# Patient Record
Sex: Male | Born: 1950 | Race: White | Hispanic: No | Marital: Single | Smoking: Former smoker
Health system: Southern US, Community
[De-identification: ages and names within clinical notes are randomized; demographics above are authoritative.]

## PROBLEM LIST (undated history)

## (undated) DIAGNOSIS — K219 Gastro-esophageal reflux disease without esophagitis: Secondary | ICD-10-CM

## (undated) HISTORY — PX: OTHER SURGICAL HISTORY: SHX169

## (undated) HISTORY — PX: HERNIA REPAIR: SHX51

## (undated) HISTORY — PX: EYE SURGERY: SHX253

## (undated) HISTORY — PX: APPENDECTOMY: SHX54

---

## 2005-12-22 ENCOUNTER — Ambulatory Visit (HOSPITAL_COMMUNITY): Admission: RE | Admit: 2005-12-22 | Discharge: 2005-12-22 | Payer: Self-pay | Admitting: Ophthalmology

## 2011-12-27 NOTE — Patient Instructions (Signed)
[name]  12/27/2011   Your procedure is scheduled on:  12/29/2011  Report to [facility] at  1020  AM.  Call this number if you have problems the morning of surgery: [phone]   Remember:   Do not eat food:After Midnight.  May have clear liquids:until Midnight .  Clear liquids include soda, tea, black coffee, apple or grape juice, broth.  Take these medicines the morning of surgery with A SIP OF WATER: none   Do not wear jewelry, make-up or nail polish.  Do not wear lotions, powders, or perfumes. You may wear deodorant.  Do not shave 48 hours prior to surgery.  Do not bring valuables to the hospital.  Contacts, dentures or bridgework may not be worn into surgery.  Leave suitcase in the car. After surgery it may be brought to your room.  For patients admitted to the hospital, checkout time is 11:00 AM the day of discharge.   Patients discharged the day of surgery will not be allowed to drive home.  Name and phone number of your driver: family Special Instructions: N/A   Please read over the following fact sheets that you were given: Pain Booklet, Surgical Site Infection Prevention, Anesthesia Post-op Instructions and Care and Recovery After Surgery Cataract A cataract is a clouding of the lens of the eye. When a lens becomes cloudy, vision is reduced based on the degree and nature of the clouding. Many cataracts reduce vision to some degree. Some cataracts make people more near-sighted as they develop. Other cataracts increase glare. Cataracts that are ignored and become worse can sometimes look white. The white color can be seen through the pupil. CAUSES   Aging. However, cataracts may occur at any age, even in newborns.   Certain drugs.   Trauma to the eye.   Certain diseases such as diabetes.   Specific eye diseases such as chronic inflammation inside the eye or a sudden attack of a rare form of glaucoma.   Inherited or acquired medical problems.  SYMPTOMS   Gradual,  progressive drop in vision in the affected eye.   Severe, rapid visual loss. This most often happens when trauma is the cause.  DIAGNOSIS  To detect a cataract, an eye doctor examines the lens. Cataracts are best diagnosed with an exam of the eyes with the pupils enlarged (dilated) by drops.  TREATMENT  For an early cataract, vision may improve by using different eyeglasses or stronger lighting. If that does not help your vision, surgery is the only effective treatment. A cataract needs to be surgically removed when vision loss interferes with your everyday activities, such as driving, reading, or watching TV. A cataract may also have to be removed if it prevents examination or treatment of another eye problem. Surgery removes the cloudy lens and usually replaces it with a substitute lens (intraocular lens, IOL).  At a time when both you and your doctor agree, the cataract will be surgically removed. If you have cataracts in both eyes, only one is usually removed at a time. This allows the operated eye to heal and be out of danger from any possible problems after surgery (such as infection or poor wound healing). In rare cases, a cataract may be doing damage to your eye. In these cases, your caregiver may advise surgical removal right away. The vast majority of people who have cataract surgery have better vision afterward. HOME CARE INSTRUCTIONS  If you are not planning surgery, you may be asked to  do the following:  Use different eyeglasses.   Use stronger or brighter lighting.   Ask your eye doctor about reducing your medicine dose or changing medicines if it is thought that a medicine caused your cataract. Changing medicines does not make the cataract go away on its own.   Become familiar with your surroundings. Poor vision can lead to injury. Avoid bumping into things on the affected side. You are at a higher risk for tripping or falling.   Exercise extreme care when driving or operating  machinery.   Wear sunglasses if you are sensitive to bright light or experiencing problems with glare.  SEEK IMMEDIATE MEDICAL CARE IF:   You have a worsening or sudden vision loss.   You notice redness, swelling, or increasing pain in the eye.   You have a fever.  Document Released: 09/12/2005 Document Revised: 09/01/2011 Document Reviewed: 05/06/2011 [facility] Patient Information 2012 [place], [place].PATIENT INSTRUCTIONS POST-ANESTHESIA  IMMEDIATELY FOLLOWING SURGERY:  Do not drive or operate machinery for the first twenty four hours after surgery.  Do not make any important decisions for twenty four hours after surgery or while taking narcotic pain medications or sedatives.  If you develop intractable nausea and vomiting or a severe headache please notify your doctor immediately.  FOLLOW-UP:  Please make an appointment with your surgeon as instructed. You do not need to follow up with anesthesia unless specifically instructed to do so.  WOUND CARE INSTRUCTIONS (if applicable):  Keep a dry clean dressing on the anesthesia/puncture wound site if there is drainage.  Once the wound has quit draining you may leave it open to air.  Generally you should leave the bandage intact for twenty four hours unless there is drainage.  If the epidural site drains for more than 36-48 hours please call the anesthesia department.  QUESTIONS?:  Please feel free to call your physician or the hospital operator if you have any questions, and they will be happy to assist you.     [facility] [address] Anesthesia Department [address] [place] [phone]

## 2011-12-28 ENCOUNTER — Encounter (HOSPITAL_COMMUNITY): Payer: Self-pay

## 2011-12-28 ENCOUNTER — Encounter (HOSPITAL_COMMUNITY)
Admission: RE | Admit: 2011-12-28 | Discharge: 2011-12-28 | Disposition: A | Discharge status: Home / Self Care | Payer: BC Managed Care – PPO | Source: Ambulatory Visit | Attending: Ophthalmology | Admitting: Ophthalmology

## 2011-12-28 ENCOUNTER — Other Ambulatory Visit: Payer: Self-pay

## 2011-12-28 ENCOUNTER — Encounter (HOSPITAL_COMMUNITY): Payer: Self-pay | Admitting: Pharmacy Technician

## 2011-12-28 HISTORY — DX: Gastro-esophageal reflux disease without esophagitis: K21.9

## 2011-12-28 LAB — HEMOGLOBIN AND HEMATOCRIT, BLOOD
HCT: 44.8 % (ref 39.0–52.0)
Hemoglobin: 14.9 g/dL (ref 13.0–17.0)

## 2011-12-28 LAB — BASIC METABOLIC PANEL
GFR calc Af Amer: >90 mL/min (ref >90)
GFR calc non Af Amer: >90 mL/min (ref >90)
Glucose, Bld: 136 mg/dL — ABNORMAL HIGH (ref 70–99)
Potassium: 4.7 mEq/L (ref 3.5–5.1)
Sodium: 137 mEq/L (ref 135–145)

## 2011-12-29 ENCOUNTER — Encounter (HOSPITAL_COMMUNITY)
Admission: RE | Disposition: A | Discharge status: Home / Self Care | Payer: Self-pay | Source: Ambulatory Visit | Attending: Ophthalmology

## 2011-12-29 ENCOUNTER — Encounter (HOSPITAL_COMMUNITY): Payer: Self-pay | Admitting: *Deleted

## 2011-12-29 ENCOUNTER — Ambulatory Visit (HOSPITAL_COMMUNITY)
Admission: RE | Admit: 2011-12-29 | Discharge: 2011-12-29 | Disposition: A | Discharge status: Home / Self Care | Payer: BC Managed Care – PPO | Source: Ambulatory Visit | Attending: Ophthalmology | Admitting: Ophthalmology

## 2011-12-29 ENCOUNTER — Encounter (HOSPITAL_COMMUNITY): Payer: Self-pay | Admitting: Anesthesiology

## 2011-12-29 ENCOUNTER — Ambulatory Visit (HOSPITAL_COMMUNITY): Payer: BC Managed Care – PPO | Admitting: Anesthesiology

## 2011-12-29 DIAGNOSIS — H2589 Other age-related cataract: Secondary | ICD-10-CM | POA: Insufficient documentation

## 2011-12-29 DIAGNOSIS — Z01812 Encounter for preprocedural laboratory examination: Secondary | ICD-10-CM | POA: Insufficient documentation

## 2011-12-29 DIAGNOSIS — Z01810 Encounter for preprocedural cardiovascular examination: Secondary | ICD-10-CM | POA: Insufficient documentation

## 2011-12-29 HISTORY — PX: CATARACT EXTRACTION W/PHACO: SHX586

## 2011-12-29 SURGERY — PHACOEMULSIFICATION, CATARACT, WITH IOL INSERTION
Anesthesia: Monitor Anesthesia Care | Site: Eye | Laterality: Right | Wound class: Clean

## 2011-12-29 MED ORDER — LACTATED RINGERS IV SOLN
INTRAVENOUS | Status: DC
  Administered 2011-12-29: 12:00:00 via INTRAVENOUS

## 2011-12-29 MED ORDER — CYCLOPENTOLATE-PHENYLEPHRINE 0.2-1 % OP SOLN
1.0000 [drp] | OPHTHALMIC | Status: AC
  Administered 2011-12-29 (×3): 1 [drp] via OPHTHALMIC

## 2011-12-29 MED ORDER — MIDAZOLAM HCL 2 MG/2ML IJ SOLN
1.0000 mg | INTRAMUSCULAR | Status: DC | PRN
  Administered 2011-12-29: 2 mg via INTRAVENOUS

## 2011-12-29 MED ORDER — ONDANSETRON HCL 4 MG/2ML IJ SOLN: 4.0000 mg | Freq: Once | INTRAMUSCULAR | Status: DC | PRN

## 2011-12-29 MED ORDER — POVIDONE-IODINE 5 % OP SOLN
OPHTHALMIC | Status: DC | PRN
  Administered 2011-12-29: 1 via OPHTHALMIC

## 2011-12-29 MED ORDER — LIDOCAINE HCL (PF) 1 % IJ SOLN
INTRAMUSCULAR | Status: AC
  Filled 2011-12-29: qty 2

## 2011-12-29 MED ORDER — PHENYLEPHRINE HCL 2.5 % OP SOLN
1.0000 [drp] | OPHTHALMIC | Status: AC
  Administered 2011-12-29 (×3): 1 [drp] via OPHTHALMIC

## 2011-12-29 MED ORDER — PROVISC 10 MG/ML IO SOLN
INTRAOCULAR | Status: DC | PRN
  Administered 2011-12-29: 8.5 mg via INTRAOCULAR

## 2011-12-29 MED ORDER — NEOMYCIN-POLYMYXIN-DEXAMETH 3.5-10000-0.1 OP OINT
TOPICAL_OINTMENT | OPHTHALMIC | Status: AC
  Filled 2011-12-29: qty 3.5

## 2011-12-29 MED ORDER — LIDOCAINE 3.5 % OP GEL OPTIME - NO CHARGE
OPHTHALMIC | Status: DC | PRN
  Administered 2011-12-29: 2 [drp] via OPHTHALMIC

## 2011-12-29 MED ORDER — EPINEPHRINE HCL 1 MG/ML IJ SOLN
INTRAOCULAR | Status: DC | PRN
  Administered 2011-12-29: 12:00:00

## 2011-12-29 MED ORDER — BSS IO SOLN
INTRAOCULAR | Status: DC | PRN
  Administered 2011-12-29: 15 mL via INTRAOCULAR

## 2011-12-29 MED ORDER — CYCLOPENTOLATE-PHENYLEPHRINE 0.2-1 % OP SOLN
OPHTHALMIC | Status: AC
  Filled 2011-12-29: qty 2

## 2011-12-29 MED ORDER — LIDOCAINE HCL 3.5 % OP GEL
OPHTHALMIC | Status: AC
  Filled 2011-12-29: qty 5

## 2011-12-29 MED ORDER — TETRACAINE HCL 0.5 % OP SOLN
1.0000 [drp] | OPHTHALMIC | Status: AC
  Administered 2011-12-29 (×3): 1 [drp] via OPHTHALMIC

## 2011-12-29 MED ORDER — PHENYLEPHRINE HCL 2.5 % OP SOLN
OPHTHALMIC | Status: AC
  Filled 2011-12-29: qty 2

## 2011-12-29 MED ORDER — EPINEPHRINE HCL 1 MG/ML IJ SOLN
INTRAMUSCULAR | Status: AC
  Filled 2011-12-29: qty 1

## 2011-12-29 MED ORDER — LIDOCAINE HCL 3.5 % OP GEL: 1.0000 "application " | Freq: Once | OPHTHALMIC | Status: DC

## 2011-12-29 MED ORDER — NEOMYCIN-POLYMYXIN-DEXAMETH 0.1 % OP OINT
TOPICAL_OINTMENT | OPHTHALMIC | Status: DC | PRN
  Administered 2011-12-29: 1 via OPHTHALMIC

## 2011-12-29 MED ORDER — TETRACAINE HCL 0.5 % OP SOLN
OPHTHALMIC | Status: AC
  Filled 2011-12-29: qty 2

## 2011-12-29 MED ORDER — LIDOCAINE HCL (PF) 1 % IJ SOLN
INTRAMUSCULAR | Status: DC | PRN
  Administered 2011-12-29: .6 mL

## 2011-12-29 MED ORDER — FENTANYL CITRATE 0.05 MG/ML IJ SOLN: 25.0000 ug | INTRAMUSCULAR | Status: DC | PRN

## 2011-12-29 MED ORDER — MIDAZOLAM HCL 2 MG/2ML IJ SOLN
INTRAMUSCULAR | Status: AC
  Filled 2011-12-29: qty 2

## 2011-12-29 SURGICAL SUPPLY — 32 items
CAPSULAR TENSION RING-AMO (OPHTHALMIC RELATED) IMPLANT
CLOTH BEACON ORANGE TIMEOUT ST (SAFETY) ×1 IMPLANT
EYE SHIELD UNIVERSAL CLEAR (GAUZE/BANDAGES/DRESSINGS) ×2 IMPLANT
GLOVE BIO SURGEON STRL SZ 6.5 (GLOVE) IMPLANT
GLOVE BIOGEL PI IND STRL 6.5 (GLOVE) IMPLANT
GLOVE BIOGEL PI IND STRL 7.0 (GLOVE) IMPLANT
GLOVE BIOGEL PI IND STRL 7.5 (GLOVE) IMPLANT
GLOVE BIOGEL PI INDICATOR 6.5 (GLOVE) ×1
GLOVE BIOGEL PI INDICATOR 7.0 (GLOVE)
GLOVE BIOGEL PI INDICATOR 7.5 (GLOVE)
GLOVE ECLIPSE 6.5 STRL STRAW (GLOVE) IMPLANT
GLOVE ECLIPSE 7.0 STRL STRAW (GLOVE) IMPLANT
GLOVE ECLIPSE 7.5 STRL STRAW (GLOVE) IMPLANT
GLOVE EXAM NITRILE LRG STRL (GLOVE) IMPLANT
GLOVE EXAM NITRILE MD LF STRL (GLOVE) ×1 IMPLANT
GLOVE SKINSENSE NS SZ6.5 (GLOVE)
GLOVE SKINSENSE NS SZ7.0 (GLOVE)
GLOVE SKINSENSE STRL SZ6.5 (GLOVE) IMPLANT
GLOVE SKINSENSE STRL SZ7.0 (GLOVE) IMPLANT
KIT VITRECTOMY (OPHTHALMIC RELATED) IMPLANT
PAD ARMBOARD 7.5X6 YLW CONV (MISCELLANEOUS) ×1 IMPLANT
PROC W NO LENS (INTRAOCULAR LENS)
PROC W SPEC LENS (INTRAOCULAR LENS)
PROCESS W NO LENS (INTRAOCULAR LENS) IMPLANT
PROCESS W SPEC LENS (INTRAOCULAR LENS) IMPLANT
RING MALYGIN (MISCELLANEOUS) IMPLANT
SIGHTPATH CAT PROC W REG LENS (Ophthalmic Related) ×2 IMPLANT
SYR TB 1ML LL NO SAFETY (SYRINGE) ×1 IMPLANT
TAPE SURG TRANSPORE 1 IN (GAUZE/BANDAGES/DRESSINGS) IMPLANT
TAPE SURGICAL TRANSPORE 1 IN (GAUZE/BANDAGES/DRESSINGS) ×1
VISCOELASTIC ADDITIONAL (OPHTHALMIC RELATED) IMPLANT
WATER STERILE IRR 250ML POUR (IV SOLUTION) ×1 IMPLANT

## 2011-12-29 NOTE — Brief Op Note (Signed)
Pre-Op Dx: Cataract OD Post-Op Dx: Cataract OD Surgeon: Shameca Landen Anesthesia: Topical with MAC Implant: Lenstec, Model Softec HD Blood Loss: None Specimen: None Complications: None 

## 2011-12-29 NOTE — Anesthesia Preprocedure Evaluation (Signed)
Anesthesia Evaluation  Patient identified by MRN, date of birth, ID band Patient awake    Reviewed: Allergy & Precautions, H&P , NPO status , Patient's Chart, lab work & pertinent test results  Airway Mallampati: I      Dental  (+) Teeth Intact   Pulmonary former smoker breath sounds clear to auscultation        Cardiovascular negative cardio ROS  Rhythm:Regular Rate:Normal     Neuro/Psych    GI/Hepatic GERD-  Medicated and Controlled,  Endo/Other    Renal/GU      Musculoskeletal   Abdominal   Peds  Hematology   Anesthesia Other Findings   Reproductive/Obstetrics                           Anesthesia Physical Anesthesia Plan  ASA: II  Anesthesia Plan: MAC   Post-op Pain Management:    Induction: Intravenous  Airway Management Planned: Nasal Cannula  Additional Equipment:   Intra-op Plan:   Post-operative Plan:   Informed Consent: I have reviewed the patients History and Physical, chart, labs and discussed the procedure including the risks, benefits and alternatives for the proposed anesthesia with the patient or authorized representative who has indicated his/her understanding and acceptance.     Plan Discussed with:   Anesthesia Plan Comments:         Anesthesia Quick Evaluation

## 2011-12-29 NOTE — Discharge Instructions (Signed)
Tyler Boone  12/29/2011     Instructions  1. Use medications as Instructed.  Shake well before use. Wait 5 minutes between drops.  {OPHTHALMIC ANTIBIOTICS:22167} 4 times a day x 1 week.  {OPHTHALMIC ANTI-INFLAMMATORY:22168} 2 times a day x 4 weeks.  {OPHTHALMIC STEROID:22169} 4 times a day - week 1   3 times a day - Week 2, 2 times a day- Week 3, 1 time a day - Week 4.  2. Do not rub the operative eye. Do not swim underwater for 2 weeks.  3. You may remove the clear shield and resume your normal activities the day after  Surgery. Your eyes may feel more comfortable if you wear dark glasses outside.  4. Call our office at [phone] if you have sudden change in vision, extreme redness or pain. Some fluctuation in vision is normal after surgery. If you have an emergency after hours, call Dr. Alto Denver at [phone].  5. It is important that you attend all of your follow-up appointments.        Follow-up:{follow up:32580} with Gemma Payor, MD.   Dr. Lahoma Crocker: [phone]  Dr. Lita Mains: [phone]  Dr. Alto Denver: [phone]   If you find that you cannot contact your physician, but feel that your signs and   Symptoms warrant a physician's attention, call the Emergency Room at   [phone].   Other{NA AND [id number]}.

## 2011-12-29 NOTE — Transfer of Care (Signed)
Immediate Anesthesia Transfer of Care Note  Patient: Tyler Boone  Procedure(s) Performed: Procedure(s) (LRB): CATARACT EXTRACTION PHACO AND INTRAOCULAR LENS PLACEMENT (IOC) (Right)  Patient Location: PACU and Short Stay  Anesthesia Type: MAC  Level of Consciousness: awake, alert , oriented and patient cooperative  Airway & Oxygen Therapy: Patient Spontanous Breathing  Post-op Assessment: Report given to PACU RN, Post -op Vital signs reviewed and stable and Patient moving all extremities  Post vital signs: Reviewed and stable  Complications: No apparent anesthesia complications

## 2011-12-29 NOTE — H&P (Signed)
I have reviewed the H&P, the patient was re-examined, and I have identified no interval changes in medical condition and plan of care since the history and physical of record  

## 2011-12-29 NOTE — Anesthesia Postprocedure Evaluation (Signed)
  Anesthesia Post-op Note  Patient: Tyler Boone  Procedure(s) Performed: Procedure(s) (LRB): CATARACT EXTRACTION PHACO AND INTRAOCULAR LENS PLACEMENT (IOC) (Right)  Patient Location: PACU and Short Stay  Anesthesia Type: MAC  Level of Consciousness: awake, alert , oriented and patient cooperative  Airway and Oxygen Therapy: Patient Spontanous Breathing  Post-op Pain: none  Post-op Assessment: Post-op Vital signs reviewed, Patient's Cardiovascular Status Stable, Respiratory Function Stable, Patent Airway and No signs of Nausea or vomiting  Post-op Vital Signs: Reviewed and stable  Complications: No apparent anesthesia complications

## 2011-12-30 NOTE — Op Note (Signed)
NAME:  Tyler Boone, Tyler Boone NO.:  [id number]  MEDICAL RECORD NO.:  [id number]  LOCATION:  [facility]                          FACILITY:  [facility]  PHYSICIAN:  Susanne Greenhouse, MD       DATE OF BIRTH:  05/14/1951  DATE OF PROCEDURE:  12/29/2011 DATE OF DISCHARGE:  12/29/2011                              OPERATIVE REPORT   PREOPERATIVE DIAGNOSIS:  Combined cataract, right eye, diagnosis code 366.19.  POSTOPERATIVE DIAGNOSIS:  Combined cataract, right eye, diagnosis code 366.19.  OPERATION PERFORMED:  Phacoemulsification with posterior chamber intraocular lens implantation, right eye.  SURGEON:  Bonne Dolores. Aadvik Roker, MD  ANESTHESIA:  General endotracheal anesthesia.  OPERATIVE SUMMARY:  In the preoperative area, dilating drops were placed into the right eye.  The patient was then brought into the operating room where she was placed under general anesthesia.  The eye was then prepped and draped.  Beginning with a 75 blade, a paracentesis port was made at the surgeon's 2 o'clock position.  The anterior chamber was then filled with a 1% nonpreserved lidocaine solution with epinephrine.  This was followed by Viscoat to deepen the chamber.  A small fornix-based peritomy was performed superiorly.  Next, a single iris hook was placed through the limbus superiorly.  A 2.4-mm keratome blade was then used to make a clear corneal incision over the iris hook.  A bent cystotome needle and Utrata forceps were used to create a continuous tear capsulotomy.  Hydrodissection was performed using balanced salt solution on a fine cannula.  The lens nucleus was then removed using phacoemulsification in a quadrant cracking technique.  The cortical material was then removed with irrigation and aspiration.  The capsular bag and anterior chamber were refilled with Provisc.  The wound was widened to approximately 3 mm and a posterior chamber intraocular lens was placed into the capsular bag without difficulty  using an [employer] lens injecting system.  A single 10-0 nylon suture was then used to close the incision as well as stromal hydration.  The Provisc was removed from the anterior chamber and capsular bag with irrigation and aspiration.  At this point, the wounds were tested for leak, which were negative.  The anterior chamber remained deep and stable.  The patient tolerated the procedure well.  There were no operative complications, and she awoke from general anesthesia without problem.  No surgical specimens.  Prosthetic device used is a Lenstec posterior chamber lens, model Softec HD, power of 23.75, serial number is [id number].          ______________________________ Susanne Greenhouse, MD     KEH/MEDQ  D:  12/29/2011  T:  12/30/2011  Job:  [id number]

## 2012-01-02 ENCOUNTER — Encounter (HOSPITAL_COMMUNITY): Payer: Self-pay | Admitting: Ophthalmology

## 2013-08-01 ENCOUNTER — Other Ambulatory Visit (HOSPITAL_COMMUNITY): Payer: Self-pay | Admitting: Pulmonary Disease

## 2013-08-01 DIAGNOSIS — R634 Abnormal weight loss: Secondary | ICD-10-CM

## 2013-08-01 DIAGNOSIS — R110 Nausea: Secondary | ICD-10-CM

## 2013-08-05 ENCOUNTER — Other Ambulatory Visit (HOSPITAL_COMMUNITY): Payer: Self-pay | Admitting: Pulmonary Disease

## 2013-08-05 ENCOUNTER — Ambulatory Visit (HOSPITAL_COMMUNITY)
Admission: RE | Admit: 2013-08-05 | Discharge: 2013-08-05 | Disposition: A | Discharge status: Home / Self Care | Payer: BC Managed Care – PPO | Source: Ambulatory Visit | Attending: Pulmonary Disease | Admitting: Pulmonary Disease

## 2013-08-05 DIAGNOSIS — R110 Nausea: Secondary | ICD-10-CM

## 2013-08-05 DIAGNOSIS — R634 Abnormal weight loss: Secondary | ICD-10-CM

## 2013-08-09 ENCOUNTER — Ambulatory Visit (HOSPITAL_COMMUNITY)
Admission: RE | Admit: 2013-08-09 | Discharge: 2013-08-09 | Disposition: A | Discharge status: Home / Self Care | Payer: BC Managed Care – PPO | Source: Ambulatory Visit | Attending: Pulmonary Disease | Admitting: Pulmonary Disease

## 2013-08-09 DIAGNOSIS — R634 Abnormal weight loss: Secondary | ICD-10-CM | POA: Insufficient documentation

## 2013-08-09 DIAGNOSIS — M47817 Spondylosis without myelopathy or radiculopathy, lumbosacral region: Secondary | ICD-10-CM | POA: Insufficient documentation

## 2013-08-09 DIAGNOSIS — R110 Nausea: Secondary | ICD-10-CM | POA: Insufficient documentation

## 2013-08-09 DIAGNOSIS — N281 Cyst of kidney, acquired: Secondary | ICD-10-CM | POA: Insufficient documentation

## 2013-08-09 DIAGNOSIS — N400 Enlarged prostate without lower urinary tract symptoms: Secondary | ICD-10-CM | POA: Insufficient documentation

## 2013-08-09 DIAGNOSIS — K429 Umbilical hernia without obstruction or gangrene: Secondary | ICD-10-CM | POA: Insufficient documentation

## 2013-08-09 LAB — POCT I-STAT CREATININE: Creatinine, Ser: 1.1 mg/dL (ref 0.50–1.35)

## 2013-08-09 MED ORDER — IOHEXOL 300 MG/ML  SOLN
100.0000 mL | Freq: Once | INTRAMUSCULAR | Status: AC | PRN
  Administered 2013-08-09: 100 mL via INTRAVENOUS

## 2014-09-16 ENCOUNTER — Ambulatory Visit (HOSPITAL_COMMUNITY)
Admission: RE | Admit: 2014-09-16 | Discharge: 2014-09-16 | Disposition: A | Discharge status: Home / Self Care | Payer: BC Managed Care – PPO | Source: Ambulatory Visit | Attending: Pulmonary Disease | Admitting: Pulmonary Disease

## 2014-09-16 ENCOUNTER — Other Ambulatory Visit (HOSPITAL_COMMUNITY): Payer: Self-pay | Admitting: Pulmonary Disease

## 2014-09-16 DIAGNOSIS — M5032 Other cervical disc degeneration, mid-cervical region: Secondary | ICD-10-CM | POA: Diagnosis not present

## 2014-09-16 DIAGNOSIS — M542 Cervicalgia: Secondary | ICD-10-CM

## 2014-09-16 DIAGNOSIS — M25512 Pain in left shoulder: Secondary | ICD-10-CM

## 2014-09-16 DIAGNOSIS — M79671 Pain in right foot: Secondary | ICD-10-CM | POA: Diagnosis present

## 2014-09-16 DIAGNOSIS — M7731 Calcaneal spur, right foot: Secondary | ICD-10-CM | POA: Diagnosis not present

## 2014-10-08 ENCOUNTER — Other Ambulatory Visit (HOSPITAL_COMMUNITY): Payer: Self-pay | Admitting: Pulmonary Disease

## 2014-10-08 DIAGNOSIS — M25519 Pain in unspecified shoulder: Secondary | ICD-10-CM

## 2014-10-08 DIAGNOSIS — M542 Cervicalgia: Principal | ICD-10-CM

## 2014-10-15 ENCOUNTER — Ambulatory Visit (HOSPITAL_COMMUNITY)
Admission: RE | Admit: 2014-10-15 | Discharge: 2014-10-15 | Disposition: A | Discharge status: Home / Self Care | Payer: BLUE CROSS/BLUE SHIELD | Source: Ambulatory Visit | Attending: Pulmonary Disease | Admitting: Pulmonary Disease

## 2014-10-15 DIAGNOSIS — M25519 Pain in unspecified shoulder: Secondary | ICD-10-CM

## 2014-10-15 DIAGNOSIS — M542 Cervicalgia: Secondary | ICD-10-CM | POA: Diagnosis not present

## 2016-06-03 ENCOUNTER — Other Ambulatory Visit (HOSPITAL_COMMUNITY): Payer: Self-pay | Admitting: Pulmonary Disease

## 2016-06-03 DIAGNOSIS — M545 Low back pain: Secondary | ICD-10-CM

## 2016-06-14 ENCOUNTER — Ambulatory Visit: Payer: BLUE CROSS/BLUE SHIELD

## 2016-06-16 ENCOUNTER — Ambulatory Visit (HOSPITAL_COMMUNITY)
Admission: RE | Admit: 2016-06-16 | Discharge: 2016-06-16 | Disposition: A | Discharge status: Home / Self Care | Payer: BLUE CROSS/BLUE SHIELD | Source: Ambulatory Visit | Attending: Pulmonary Disease | Admitting: Pulmonary Disease

## 2016-06-16 DIAGNOSIS — M545 Low back pain: Secondary | ICD-10-CM | POA: Insufficient documentation

## 2016-06-16 DIAGNOSIS — M47896 Other spondylosis, lumbar region: Secondary | ICD-10-CM | POA: Diagnosis not present

## 2016-07-07 ENCOUNTER — Other Ambulatory Visit: Payer: Self-pay | Admitting: Pulmonary Disease

## 2016-07-07 DIAGNOSIS — M545 Low back pain: Secondary | ICD-10-CM

## 2016-07-15 ENCOUNTER — Ambulatory Visit
Admission: RE | Admit: 2016-07-15 | Discharge: 2016-07-15 | Disposition: A | Discharge status: Home / Self Care | Payer: BLUE CROSS/BLUE SHIELD | Source: Ambulatory Visit | Attending: Pulmonary Disease | Admitting: Pulmonary Disease

## 2016-07-15 DIAGNOSIS — M545 Low back pain: Secondary | ICD-10-CM

## 2016-07-15 MED ORDER — METHYLPREDNISOLONE ACETATE 40 MG/ML INJ SUSP (RADIOLOG
120.0000 mg | Freq: Once | INTRAMUSCULAR | Status: AC
  Administered 2016-07-15: 120 mg via EPIDURAL

## 2016-07-15 MED ORDER — IOPAMIDOL (ISOVUE-M 200) INJECTION 41%
1.0000 mL | Freq: Once | INTRAMUSCULAR | Status: AC
  Administered 2016-07-15: 1 mL via EPIDURAL

## 2016-07-15 NOTE — Discharge Instructions (Signed)

## 2017-04-04 DIAGNOSIS — J301 Allergic rhinitis due to pollen: Secondary | ICD-10-CM | POA: Diagnosis not present

## 2017-04-04 DIAGNOSIS — M542 Cervicalgia: Secondary | ICD-10-CM | POA: Diagnosis not present

## 2017-04-04 DIAGNOSIS — G4700 Insomnia, unspecified: Secondary | ICD-10-CM | POA: Diagnosis not present

## 2017-04-04 DIAGNOSIS — M545 Low back pain: Secondary | ICD-10-CM | POA: Diagnosis not present

## 2017-05-02 DIAGNOSIS — B078 Other viral warts: Secondary | ICD-10-CM | POA: Diagnosis not present

## 2017-05-25 DIAGNOSIS — Z79891 Long term (current) use of opiate analgesic: Secondary | ICD-10-CM | POA: Diagnosis not present

## 2017-08-23 DIAGNOSIS — M545 Low back pain: Secondary | ICD-10-CM | POA: Diagnosis not present

## 2017-08-23 DIAGNOSIS — Z23 Encounter for immunization: Secondary | ICD-10-CM | POA: Diagnosis not present

## 2017-08-23 DIAGNOSIS — M25512 Pain in left shoulder: Secondary | ICD-10-CM | POA: Diagnosis not present

## 2017-08-23 DIAGNOSIS — G4700 Insomnia, unspecified: Secondary | ICD-10-CM | POA: Diagnosis not present

## 2017-08-23 DIAGNOSIS — J301 Allergic rhinitis due to pollen: Secondary | ICD-10-CM | POA: Diagnosis not present

## 2017-11-22 DIAGNOSIS — F17200 Nicotine dependence, unspecified, uncomplicated: Secondary | ICD-10-CM | POA: Diagnosis not present

## 2017-11-22 DIAGNOSIS — G4700 Insomnia, unspecified: Secondary | ICD-10-CM | POA: Diagnosis not present

## 2017-11-22 DIAGNOSIS — M25512 Pain in left shoulder: Secondary | ICD-10-CM | POA: Diagnosis not present

## 2017-11-22 DIAGNOSIS — Z79891 Long term (current) use of opiate analgesic: Secondary | ICD-10-CM | POA: Diagnosis not present

## 2017-11-22 DIAGNOSIS — M545 Low back pain: Secondary | ICD-10-CM | POA: Diagnosis not present

## 2017-12-05 ENCOUNTER — Other Ambulatory Visit (HOSPITAL_COMMUNITY): Payer: Self-pay | Admitting: Pulmonary Disease

## 2017-12-05 DIAGNOSIS — M5440 Lumbago with sciatica, unspecified side: Secondary | ICD-10-CM

## 2017-12-12 ENCOUNTER — Ambulatory Visit (HOSPITAL_COMMUNITY)
Admission: RE | Admit: 2017-12-12 | Discharge: 2017-12-12 | Disposition: A | Discharge status: Home / Self Care | Payer: PPO | Source: Ambulatory Visit | Attending: Pulmonary Disease | Admitting: Pulmonary Disease

## 2017-12-12 DIAGNOSIS — M5440 Lumbago with sciatica, unspecified side: Secondary | ICD-10-CM | POA: Diagnosis not present

## 2017-12-12 DIAGNOSIS — M5124 Other intervertebral disc displacement, thoracic region: Secondary | ICD-10-CM | POA: Diagnosis not present

## 2018-02-21 DIAGNOSIS — J301 Allergic rhinitis due to pollen: Secondary | ICD-10-CM | POA: Diagnosis not present

## 2018-02-21 DIAGNOSIS — G4700 Insomnia, unspecified: Secondary | ICD-10-CM | POA: Diagnosis not present

## 2018-02-21 DIAGNOSIS — M545 Low back pain: Secondary | ICD-10-CM | POA: Diagnosis not present

## 2018-02-21 DIAGNOSIS — M5412 Radiculopathy, cervical region: Secondary | ICD-10-CM | POA: Diagnosis not present

## 2018-05-24 DIAGNOSIS — Z23 Encounter for immunization: Secondary | ICD-10-CM | POA: Diagnosis not present

## 2018-05-24 DIAGNOSIS — Z79891 Long term (current) use of opiate analgesic: Secondary | ICD-10-CM | POA: Diagnosis not present

## 2018-05-24 DIAGNOSIS — F17200 Nicotine dependence, unspecified, uncomplicated: Secondary | ICD-10-CM | POA: Diagnosis not present

## 2018-05-24 DIAGNOSIS — J301 Allergic rhinitis due to pollen: Secondary | ICD-10-CM | POA: Diagnosis not present

## 2018-05-24 DIAGNOSIS — M545 Low back pain: Secondary | ICD-10-CM | POA: Diagnosis not present

## 2018-05-24 DIAGNOSIS — G4700 Insomnia, unspecified: Secondary | ICD-10-CM | POA: Diagnosis not present

## 2018-06-07 DIAGNOSIS — F17200 Nicotine dependence, unspecified, uncomplicated: Secondary | ICD-10-CM | POA: Diagnosis not present

## 2018-06-07 DIAGNOSIS — G4700 Insomnia, unspecified: Secondary | ICD-10-CM | POA: Diagnosis not present

## 2018-06-07 DIAGNOSIS — M545 Low back pain: Secondary | ICD-10-CM | POA: Diagnosis not present

## 2018-06-07 DIAGNOSIS — J301 Allergic rhinitis due to pollen: Secondary | ICD-10-CM | POA: Diagnosis not present

## 2018-06-28 DIAGNOSIS — Z0000 Encounter for general adult medical examination without abnormal findings: Secondary | ICD-10-CM | POA: Diagnosis not present

## 2018-06-28 DIAGNOSIS — Z23 Encounter for immunization: Secondary | ICD-10-CM | POA: Diagnosis not present

## 2018-07-10 DIAGNOSIS — Z1211 Encounter for screening for malignant neoplasm of colon: Secondary | ICD-10-CM | POA: Diagnosis not present

## 2018-10-01 DIAGNOSIS — J111 Influenza due to unidentified influenza virus with other respiratory manifestations: Secondary | ICD-10-CM | POA: Diagnosis not present

## 2018-10-01 DIAGNOSIS — M545 Low back pain: Secondary | ICD-10-CM | POA: Diagnosis not present

## 2018-10-01 DIAGNOSIS — F17200 Nicotine dependence, unspecified, uncomplicated: Secondary | ICD-10-CM | POA: Diagnosis not present

## 2018-10-01 DIAGNOSIS — J309 Allergic rhinitis, unspecified: Secondary | ICD-10-CM | POA: Diagnosis not present

## 2019-03-07 DIAGNOSIS — J301 Allergic rhinitis due to pollen: Secondary | ICD-10-CM | POA: Diagnosis not present

## 2019-03-07 DIAGNOSIS — F17200 Nicotine dependence, unspecified, uncomplicated: Secondary | ICD-10-CM | POA: Diagnosis not present

## 2019-03-07 DIAGNOSIS — M542 Cervicalgia: Secondary | ICD-10-CM | POA: Diagnosis not present

## 2019-03-07 DIAGNOSIS — M545 Low back pain: Secondary | ICD-10-CM | POA: Diagnosis not present

## 2019-04-24 ENCOUNTER — Other Ambulatory Visit: Payer: Self-pay

## 2019-06-07 DIAGNOSIS — J449 Chronic obstructive pulmonary disease, unspecified: Secondary | ICD-10-CM | POA: Diagnosis not present

## 2019-06-07 DIAGNOSIS — M545 Low back pain: Secondary | ICD-10-CM | POA: Diagnosis not present

## 2019-06-07 DIAGNOSIS — Z23 Encounter for immunization: Secondary | ICD-10-CM | POA: Diagnosis not present

## 2019-06-07 DIAGNOSIS — J301 Allergic rhinitis due to pollen: Secondary | ICD-10-CM | POA: Diagnosis not present

## 2019-06-07 DIAGNOSIS — F17200 Nicotine dependence, unspecified, uncomplicated: Secondary | ICD-10-CM | POA: Diagnosis not present

## 2019-06-09 IMAGING — MR MR THORACIC SPINE W/O CM
4 of 7 series · 12 of 48 positions shown · non-contrast
Comparison: CT chest 08/09/2013

CLINICAL DATA: Upper back pain over the last 3 years.

EXAM:
MRI THORACIC SPINE WITHOUT CONTRAST
TECHNIQUE: Multiplanar, multisequence MR imaging of the thoracic spine was
performed. No intravenous contrast was administered.

[Series 6: T1 · sagittal · 4.0mm · 0.78mm/px · 3 of 13 slices shown]
[im 1/13]
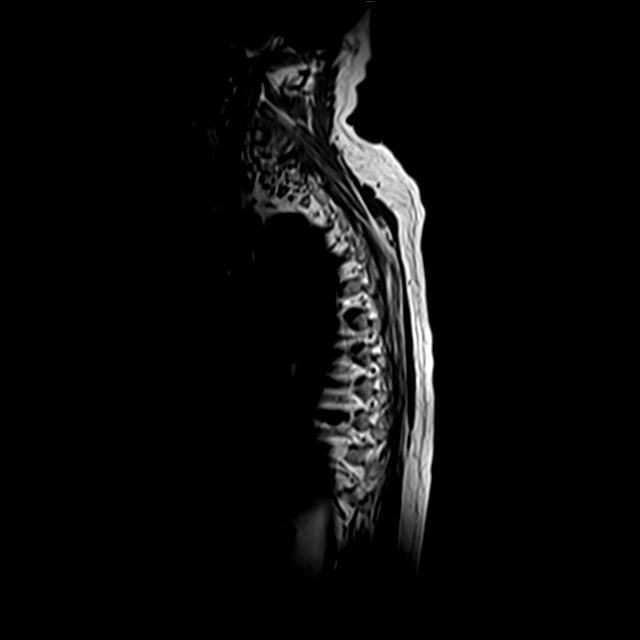
[im 9/13]
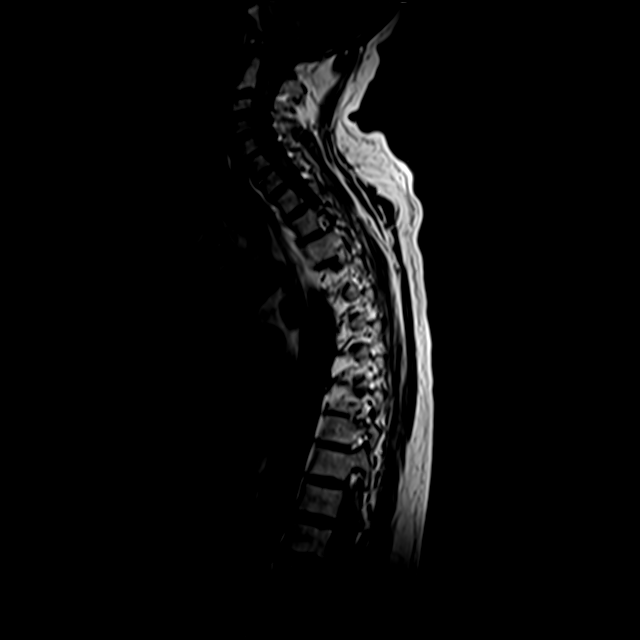
[im 13/13]
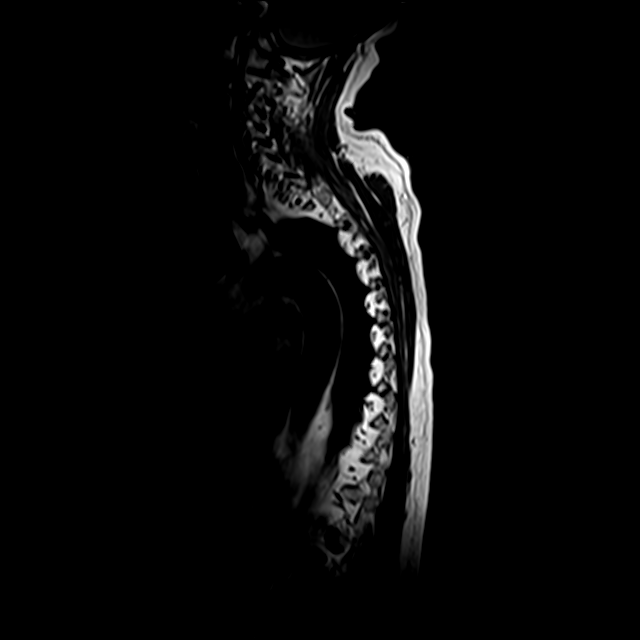

[Series 7: T2 · sagittal · 4.0mm · 0.49mm/px · 3 of 13 slices shown (1 of 3)]
[im 1/13]
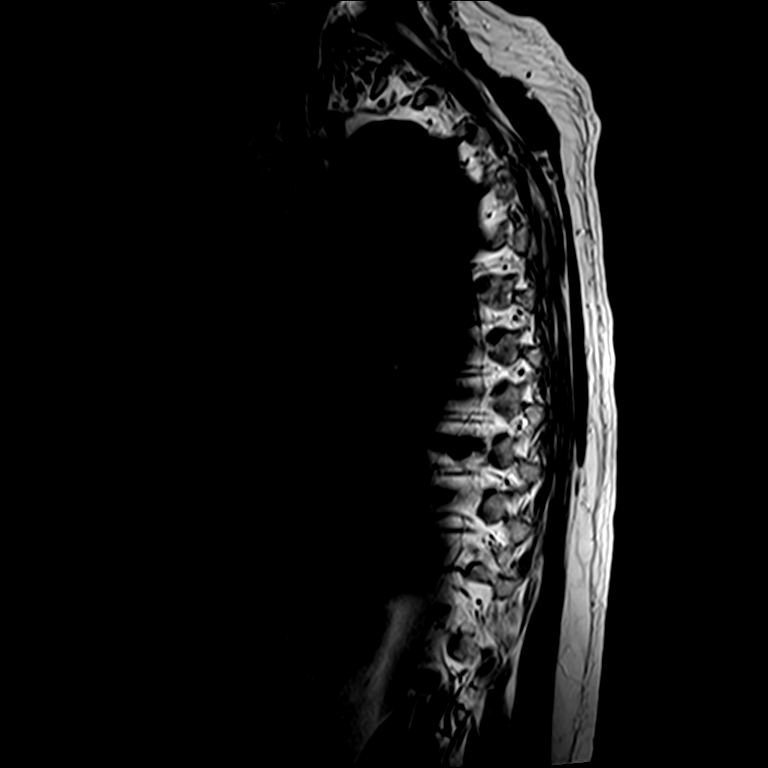
[im 9/13]
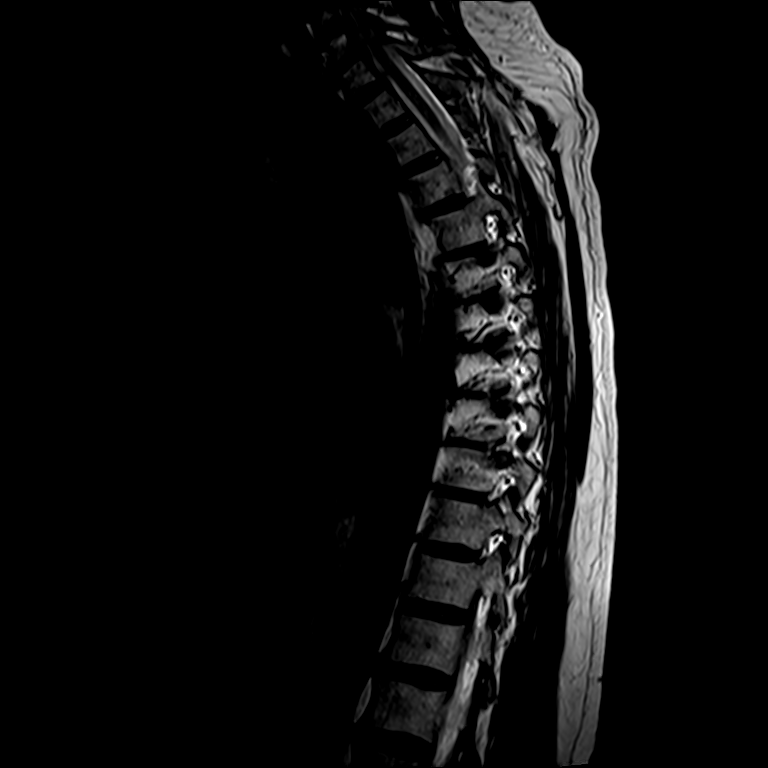
[im 13/13]
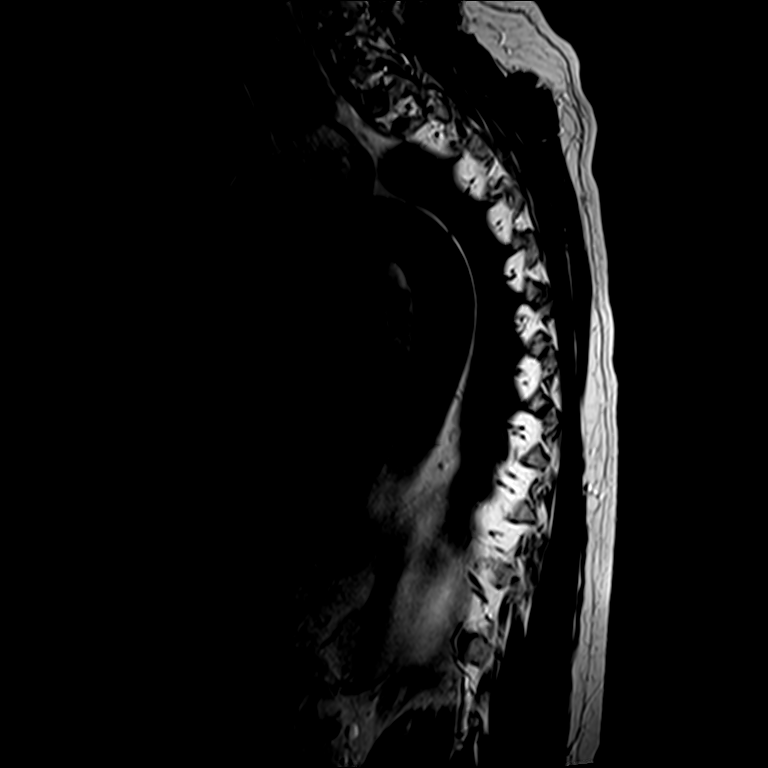

[Series 11: T2 · axial · 3.0mm · 0.23mm/px · z∈[-219,-53]mm · 3 of 36 slices shown (2 of 3)]
[im 7/36]
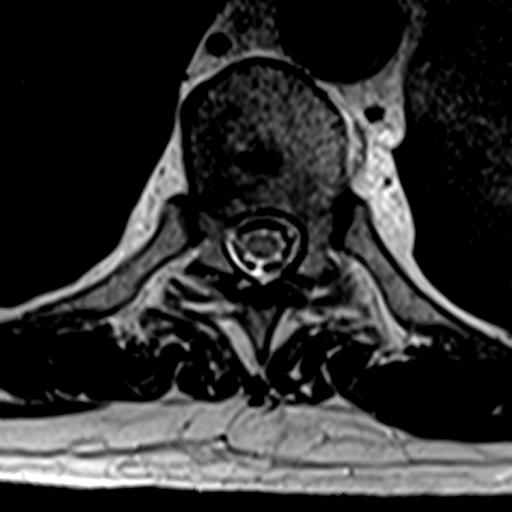
[im 20/36]
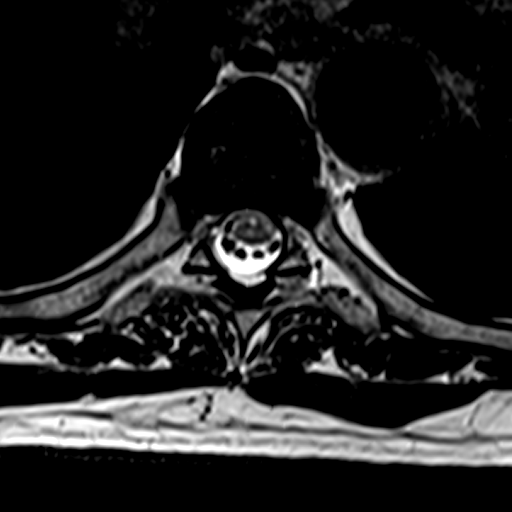
[im 32/36]
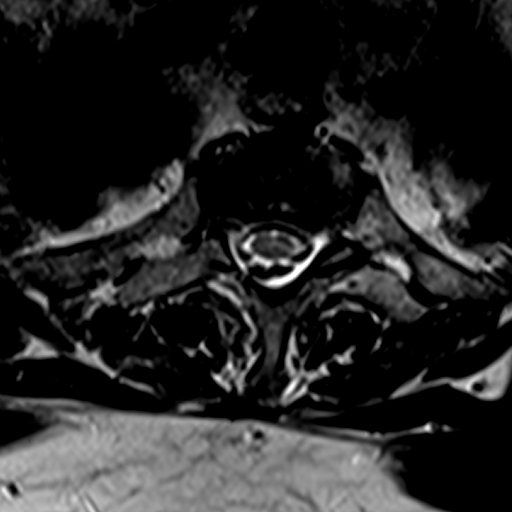

[Series 12: T2 · coronal · 4.0mm · 0.59mm/px · 3 of 23 slices shown (3 of 3)]
[im 4/23]
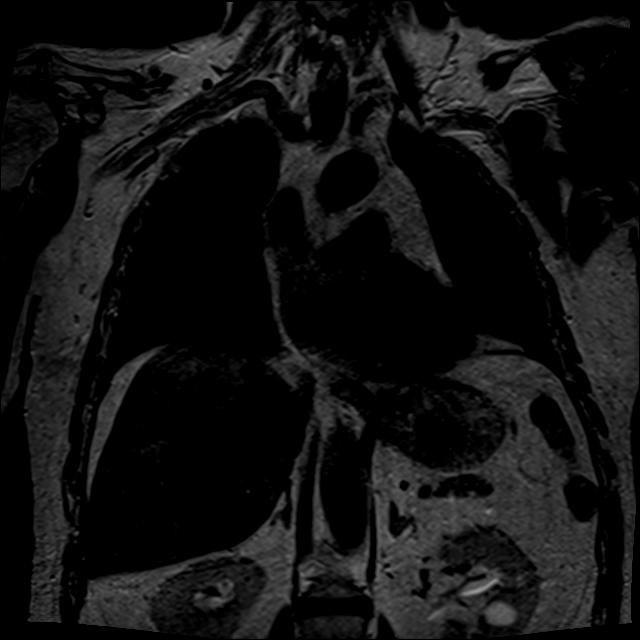
[im 13/23]
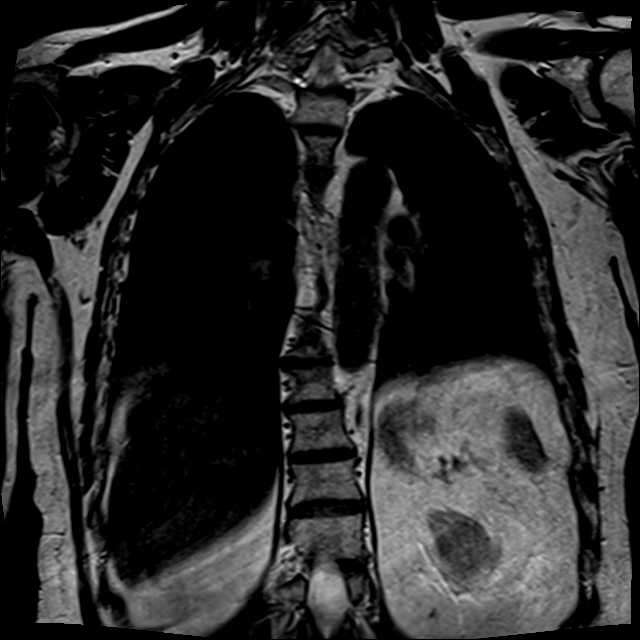
[im 19/23]
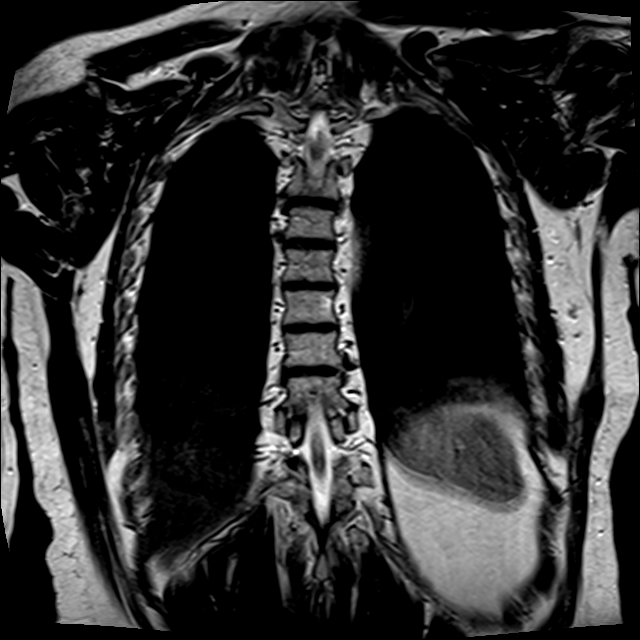

[12 of 48 positions shown; findings below may reference images not displayed]

FINDINGS: Alignment: No antero or retrolisthesis or abnormal kyphosis. There
may be mild curvature convex to the right in the upper thoracic
region.

Vertebrae: No fracture or primary bone lesion.

Cord:  No cord compression or primary cord lesion.

Paraspinal and other soft tissues: Negative

Disc levels:

No disc abnormality from T1-2 through T5-6.

T6-7: Minimal disc bulge.  No stenosis or neural compression.

T7-8: Minimal central disc protrusion. Slight indentation of the
thecal sac but no compressive effect upon the cord. No foraminal
extension.

T8-9 through T12-L1: Normal.

No evidence of facet arthropathy.
IMPRESSION: No advanced or likely significant finding. Mild curvature convex to
the right in the upper thoracic region. Minimal disc bulges at T6-7
and T7-8 but without neural compression.

## 2019-10-14 DIAGNOSIS — G8929 Other chronic pain: Secondary | ICD-10-CM | POA: Diagnosis not present

## 2019-10-14 DIAGNOSIS — G4709 Other insomnia: Secondary | ICD-10-CM | POA: Diagnosis not present

## 2019-10-14 DIAGNOSIS — Z1211 Encounter for screening for malignant neoplasm of colon: Secondary | ICD-10-CM | POA: Diagnosis not present

## 2019-10-14 DIAGNOSIS — R195 Other fecal abnormalities: Secondary | ICD-10-CM | POA: Diagnosis not present

## 2019-10-14 DIAGNOSIS — Z7689 Persons encountering health services in other specified circumstances: Secondary | ICD-10-CM | POA: Diagnosis not present

## 2019-10-14 DIAGNOSIS — M546 Pain in thoracic spine: Secondary | ICD-10-CM | POA: Diagnosis not present

## 2020-03-05 DIAGNOSIS — F4320 Adjustment disorder, unspecified: Secondary | ICD-10-CM | POA: Diagnosis not present

## 2020-03-05 DIAGNOSIS — Z136 Encounter for screening for cardiovascular disorders: Secondary | ICD-10-CM | POA: Diagnosis not present

## 2020-03-05 DIAGNOSIS — K219 Gastro-esophageal reflux disease without esophagitis: Secondary | ICD-10-CM | POA: Diagnosis not present

## 2020-03-05 DIAGNOSIS — Z0000 Encounter for general adult medical examination without abnormal findings: Secondary | ICD-10-CM | POA: Diagnosis not present

## 2020-03-05 DIAGNOSIS — Z7289 Other problems related to lifestyle: Secondary | ICD-10-CM | POA: Diagnosis not present

## 2020-03-05 DIAGNOSIS — R7302 Impaired glucose tolerance (oral): Secondary | ICD-10-CM | POA: Diagnosis not present

## 2020-03-05 DIAGNOSIS — Z13220 Encounter for screening for lipoid disorders: Secondary | ICD-10-CM | POA: Diagnosis not present

## 2020-03-05 DIAGNOSIS — Z125 Encounter for screening for malignant neoplasm of prostate: Secondary | ICD-10-CM | POA: Diagnosis not present

## 2020-04-16 DIAGNOSIS — K219 Gastro-esophageal reflux disease without esophagitis: Secondary | ICD-10-CM | POA: Insufficient documentation

## 2020-04-16 DIAGNOSIS — F4329 Adjustment disorder with other symptoms: Secondary | ICD-10-CM | POA: Diagnosis not present

## 2020-04-16 DIAGNOSIS — K2100 Gastro-esophageal reflux disease with esophagitis, without bleeding: Secondary | ICD-10-CM | POA: Diagnosis not present

## 2020-04-16 DIAGNOSIS — M5440 Lumbago with sciatica, unspecified side: Secondary | ICD-10-CM | POA: Diagnosis not present

## 2020-04-16 DIAGNOSIS — G8929 Other chronic pain: Secondary | ICD-10-CM | POA: Diagnosis not present

## 2020-04-16 DIAGNOSIS — F5102 Adjustment insomnia: Secondary | ICD-10-CM | POA: Diagnosis not present

## 2020-11-02 DIAGNOSIS — M5440 Lumbago with sciatica, unspecified side: Secondary | ICD-10-CM | POA: Diagnosis not present

## 2020-11-02 DIAGNOSIS — Z23 Encounter for immunization: Secondary | ICD-10-CM | POA: Diagnosis not present

## 2020-11-02 DIAGNOSIS — G8929 Other chronic pain: Secondary | ICD-10-CM | POA: Diagnosis not present

## 2021-03-09 DIAGNOSIS — R195 Other fecal abnormalities: Secondary | ICD-10-CM | POA: Diagnosis not present

## 2021-03-09 DIAGNOSIS — Z0000 Encounter for general adult medical examination without abnormal findings: Secondary | ICD-10-CM | POA: Diagnosis not present

## 2021-03-09 DIAGNOSIS — Z13220 Encounter for screening for lipoid disorders: Secondary | ICD-10-CM | POA: Diagnosis not present

## 2021-03-09 DIAGNOSIS — R42 Dizziness and giddiness: Secondary | ICD-10-CM | POA: Diagnosis not present

## 2021-03-09 DIAGNOSIS — Z1211 Encounter for screening for malignant neoplasm of colon: Secondary | ICD-10-CM | POA: Diagnosis not present

## 2021-03-09 DIAGNOSIS — Z136 Encounter for screening for cardiovascular disorders: Secondary | ICD-10-CM | POA: Diagnosis not present

## 2021-03-09 DIAGNOSIS — F4320 Adjustment disorder, unspecified: Secondary | ICD-10-CM | POA: Diagnosis not present

## 2021-03-09 DIAGNOSIS — R7302 Impaired glucose tolerance (oral): Secondary | ICD-10-CM | POA: Diagnosis not present

## 2021-03-22 DIAGNOSIS — R195 Other fecal abnormalities: Secondary | ICD-10-CM | POA: Diagnosis not present

## 2021-03-22 DIAGNOSIS — Z23 Encounter for immunization: Secondary | ICD-10-CM | POA: Diagnosis not present

## 2021-04-07 DIAGNOSIS — R42 Dizziness and giddiness: Secondary | ICD-10-CM | POA: Diagnosis not present

## 2022-03-30 LAB — COLOGUARD: COLOGUARD: NEGATIVE

## 2024-07-11 NOTE — Progress Notes (Signed)
 Subjective:  Patient ID: Tyler Boone, male    DOB: [birth year], 73 y.o.   MRN: [id number]  Patient Care Team: Deitra Morton Sebastian Nena, NP as PCP - General (Nurse Practitioner)   Chief Complaint:  Establish Care and Joint Swelling (Bilateral ankle swelling in the mornings and lasts all day , no pain)   HPI: Tyler Boone is a 73 y.o. male presenting on 07/15/2024 for Establish Care and Joint Swelling (Bilateral ankle swelling in the mornings and lasts all day , no pain)   Discussed the use of AI scribe software for clinical note transcription with the patient, who gave verbal consent to proceed.  History of Present Illness Tyler Boone is a 73 year old male who presents with bilateral ankle swelling.  He has been experiencing bilateral ankle swelling, which he attributes to his gabapentin use. He has been taking gabapentin 600 mg daily for approximately two years. No associated chest pain, shortness of breath, or history of heart disease. No history of heart attack, stroke, or diabetes. No pain in his legs or calves, only swelling.  He is currently taking meloxicam for a foot spur, which he has been on for about a month. He reports no current pain in his foot or ankle but mentions difficulty walking due to the spur. He has stopped taking vitamin B12, which he had been using until a few months ago, due to concerns about interactions with meloxicam. He continues to take vitamin D.  He quit smoking approximately eight or nine years ago, denies lung cancer screening.  he also reports having undergone a Cologuard test about a year ago, will request his record from [facility].     07/15/2024    2:59 PM  PHQ9 SCORE ONLY  PHQ-9 Total Score 0       07/15/2024    3:01 PM  GAD 7 : Generalized Anxiety Score  Nervous, Anxious, on Edge 0  Control/stop worrying 0  Worry too much - different things 0  Trouble relaxing 0  Restless 0  Easily annoyed or irritable 0  Afraid - awful might  happen 0  Total GAD 7 Score 0  Anxiety Difficulty Not difficult at all      Relevant past medical, surgical, family, and social history reviewed and updated as indicated.  Allergies and medications reviewed and updated. Data reviewed: Chart in Epic.   Past Medical History:  Diagnosis Date   GERD (gastroesophageal reflux disease)     Past Surgical History:  Procedure Laterality Date   APPENDECTOMY     CATARACT EXTRACTION W/PHACO  12/29/2011   Procedure: CATARACT EXTRACTION PHACO AND INTRAOCULAR LENS PLACEMENT (IOC);  Surgeon: Cherene Mania, MD;  Location: [facility];  Service: Ophthalmology;  Laterality: Right;  CDE: 7.32   EYE SURGERY     left KPE w/IOL   HERNIA REPAIR     right inguinal   left thumb surgery     pinning    Social History   Socioeconomic History   Marital status: Single    Spouse name: Not on file   Number of children: Not on file   Years of education: Not on file   Highest education level: Not on file  Occupational History   Not on file  Tobacco Use   Smoking status: Former    Types: Cigarettes   Smokeless tobacco: Not on file  Substance and Sexual Activity   Alcohol use: Yes    Comment: occ.   Drug use:  No   Sexual activity: Not on file  Other Topics Concern   Not on file  Social History Narrative   Not on file   Social Drivers of Health   Financial Resource Strain: Low Risk  (03/22/2023)   Received from [facility]   Overall Financial Resource Strain (CARDIA)    Difficulty of Paying Living Expenses: Not hard at all  Food Insecurity: No Food Insecurity (02/23/2024)   Received from [facility]   Hunger Vital Sign    Within the past 12 months, you worried that your food would run out before you got the money to buy more.: Never true    Within the past 12 months, the food you bought just didn't last and you didn't have money to get more.: Never true  Transportation Needs: No Transportation Needs (02/23/2024)   Received from [facility] - Transportation    Lack of Transportation (Medical): No    Lack of Transportation (Non-Medical): No  Physical Activity: Sufficiently Active (02/23/2024)   Received from [facility]   Exercise Vital Sign    On average, how many days per week do you engage in moderate to strenuous exercise (like a brisk walk)?: 7 days    On average, how many minutes do you engage in exercise at this level?: 60 min  Stress: No Stress Concern Present (03/22/2023)   Received from [facility] - Occupational Stress Questionnaire    Feeling of Stress : Not at all  Social Connections: Moderately Isolated (02/23/2024)   Received from [facility]   Social Connection and Isolation Panel    In a typical week, how many times do you talk on the phone with family, friends, or neighbors?: Three times a week    How often do you get together with friends or relatives?: Three times a week    How often do you attend church or religious services?: More than 4 times per year    Do you belong to any clubs or organizations such as church groups, unions, fraternal or athletic groups, or school groups?: No    How often do you attend meetings of the clubs or organizations you belong to?: Never    Are you married, widowed, divorced, separated, never married, or living with a partner?: Divorced  Intimate Partner Violence: Not At Risk (02/23/2024)   Received from [facility]   Humiliation, Afraid, Rape, and Kick questionnaire    Within the last year, have you been afraid of your partner or ex-partner?: No    Within the last year, have you been humiliated or emotionally abused in other ways by your partner or ex-partner?: No    Within the last year, have you been kicked, hit, slapped, or otherwise physically hurt by your partner or ex-partner?: No    Within the last year, have you been raped or forced to have any kind of sexual activity by your partner or ex-partner?: No     Outpatient Encounter Medications as of 07/15/2024  Medication Sig   atorvastatin (LIPITOR) 10 MG tablet Take 10 mg by mouth daily.   carbamide peroxide (DEBROX) 6.5 % OTIC solution Place 5 drops into the right ear 2 (two) times daily.   furosemide (LASIX) 20 MG tablet 1 tab daily fro 7 days, use PRN 1-2 weekly   gabapentin (NEURONTIN) 300 MG capsule Take 300 mg by mouth 2 (two) times daily.  meloxicam (MOBIC) 15 MG tablet Take 15 mg by mouth daily.   cholecalciferol (VITAMIN D) 400 UNITS TABS Take 400 Units by mouth. (Patient not taking: Reported on 07/15/2024)   vitamin B-12 (CYANOCOBALAMIN) 1000 MCG tablet Take 1,000 mcg by mouth daily. (Patient not taking: Reported on 07/15/2024)   No facility-administered encounter medications on file as of 07/15/2024.    No Known Allergies  Pertinent ROS per HPI, otherwise unremarkable      Objective:  BP 129/78   Pulse 63   Temp 97.7 F (36.5 C) (Temporal)   Ht 5' 8 (1.727 m)   Wt 219 lb (99.3 kg)   SpO2 95%   BMI 33.30 kg/m    Wt Readings from Last 3 Encounters:  07/15/24 219 lb (99.3 kg)  12/28/11 174 lb (78.9 kg)   BP Readings from Last 3 Encounters:  07/15/24 129/78  07/15/16 (!) 148/93  12/29/11 130/76     Physical Exam Vitals and nursing note reviewed.  Constitutional:      General: He is not in acute distress. HENT:     Head: Normocephalic and atraumatic.     Right Ear: Tympanic membrane, ear canal and external ear normal. There is no impacted cerumen.     Left Ear: Tympanic membrane, ear canal and external ear normal. There is no impacted cerumen.     Nose: Nose normal.     Mouth/Throat:     Mouth: Mucous membranes are moist.  Eyes:     General: No scleral icterus.    Extraocular Movements: Extraocular movements intact.     Pupils: Pupils are equal, round, and reactive to light.  Neck:     Vascular: No carotid bruit.  Cardiovascular:     Heart sounds: Normal heart sounds.  Pulmonary:     Effort:  Pulmonary effort is normal.     Breath sounds: Normal breath sounds.  Abdominal:     General: Bowel sounds are normal.     Palpations: Abdomen is soft.  Musculoskeletal:        General: Normal range of motion.     Cervical back: Normal range of motion and neck supple. No rigidity or tenderness.     Right lower leg: 2+ Edema present.     Left lower leg: 2+ Edema present.  Lymphadenopathy:     Cervical: No cervical adenopathy.  Skin:    General: Skin is warm and dry.     Findings: No rash.  Neurological:     Mental Status: He is alert and oriented to person, place, and time.  Psychiatric:        Mood and Affect: Mood normal.        Behavior: Behavior normal.        Thought Content: Thought content normal.        Judgment: Judgment normal.    Physical Exam HEENT: Cerumen impaction in the right ear.     Results for orders placed or performed during the hospital encounter of 08/09/13  I-STAT creatinine   Collection Time: 08/09/13 10:43 AM  Result Value Ref Range   Creatinine, Ser 1.10 0.50 - 1.35 mg/dL       Pertinent labs & imaging results that were available during my care of the patient were reviewed by me and considered in my medical decision making.  Assessment & Plan:  Christophr was seen today for establish care and joint swelling.  Diagnoses and all orders for this visit:  Encounter for general adult medical examination with abnormal  findings -     CBC with Differential/Platelet -     Comprehensive metabolic panel with GFR -     Lipid panel -     Thyroid  Panel With TSH -     PSA, total and free  Mixed hyperlipidemia -     Lipid panel  Vitamin D deficiency -     VITAMIN D 25 Hydroxy (Vit-D Deficiency, Fractures)  Nocturia -     PSA, total and free  B12 deficiency -     Vitamin B12  Asymmetric edema of both lower extremities -     furosemide (LASIX) 20 MG tablet; 1 tab daily fro 7 days, use PRN 1-2 weekly  Impacted cerumen of right ear -     carbamide  peroxide (DEBROX) 6.5 % OTIC solution; Place 5 drops into the right ear 2 (two) times daily.     Assessment and Plan Kawan is a 73 year old Caucasian male seen today to establish care, no acute distress Assessment & Plan Bilateral lower extremity edema likely gabapentin-induced Edema likely due to gabapentin use. No cardiac involvement. Gabapentin not beneficial, agreed to discontinue. - Initiate gabapentin taper: reduce to one tablet daily for two weeks, then one tablet every other day for two weeks, then one tablet every three days for two weeks, then discontinue. - Prescribe Lasix daily in the morning for seven days. - Advise monitoring for persistent swelling post-Lasix and schedule earlier follow-up if needed.  Vitamin D deficiency Vitamin D supplementation ongoing. B12 supplementation stopped due to misunderstanding. B12 important for neurological health. - Check serum B12 and vitamin D levels. - If B12 low, discontinue meloxicam and consider alternative pain management.  Impacted cerumen, right ear - Prescribe Debrox ear drops to dislodge earwax.  Labs today CBC, CMP, lipid, TSH, vitamin D, vitamin B result pending   Health maintenance: Flu vaccine administered, denied lung cancer screening; will request Cologuard result from [facility]  Continue all other maintenance medications.  Follow up plan: Return in about 3 months (around 10/15/2024) for chronic Diseases Management.   Continue healthy lifestyle choices, including diet (rich in fruits, vegetables, and lean proteins, and low in salt and simple carbohydrates) and exercise (at least 30 minutes of moderate physical activity daily).  Educational handout given for    Clinical References  Edema  Edema is when you have too much fluid in your body or under your skin. Edema may make your legs, feet, and ankles swell. Swelling often happens in looser tissues, such as around your eyes. This is a common condition. It gets more common  as you get older. There are many possible causes of edema. These include: Eating too much salt (sodium). Being on your feet or sitting for a long time. Certain medical conditions, such as: Pregnancy. Heart failure. Liver disease. Kidney disease. Cancer. Hot weather may make edema worse. Edema is usually painless. Your skin may look swollen or shiny. Follow these instructions at home: Medicines Take over-the-counter and prescription medicines only as told by your doctor. Your doctor may prescribe a medicine to help your body get rid of extra water (diuretic). Take this medicine if you are told to take it. Eating and drinking Eat a low-salt (low-sodium) diet as told by your doctor. Sometimes, eating less salt may reduce swelling. Depending on the cause of your swelling, you may need to limit how much fluid you drink (fluid restriction). General instructions Raise the injured area above the level of your heart while you are sitting or lying down.  Do not sit still or stand for a long time. Do not wear tight clothes. Do not wear garters on your upper legs. Exercise your legs. This can help the swelling go down. Wear compression stockings as told by your doctor. It is important that these are the right size. These should be prescribed by your doctor to prevent possible injuries. If elastic bandages or wraps are recommended, use them as told by your doctor. Contact a doctor if: Treatment is not working. You have heart, liver, or kidney disease and have symptoms of edema. You have sudden and unexplained weight gain. Get help right away if: You have shortness of breath or chest pain. You cannot breathe when you lie down. You have pain, redness, or warmth in the swollen areas. You have heart, liver, or kidney disease and get edema all of a sudden. You have a fever and your symptoms get worse all of a sudden. These symptoms may be an emergency. Get help right away. Call 911. Do not wait to  see if the symptoms will go away. Do not drive yourself to the hospital. Summary Edema is when you have too much fluid in your body or under your skin. Edema may make your legs, feet, and ankles swell. Swelling often happens in looser tissues, such as around your eyes. Raise the injured area above the level of your heart while you are sitting or lying down. Follow your doctor's instructions about diet and how much fluid you can drink. This information is not intended to replace advice given to you by your health care provider. Make sure you discuss any questions you have with your health care provider. Document Revised: 05/17/2021 Document Reviewed: 05/17/2021 Elsevier Patient Education  2024 [employer]. Vitamin B12 Deficiency Vitamin B12 deficiency means that your body does not have enough vitamin B12. The body needs this important vitamin: To make red blood cells. To make genes (DNA). To help the nerves work. If you do not have enough vitamin B12 in your body, you can have health problems, such as not having enough red blood cells in the blood (anemia). What are the causes? Not eating enough foods that contain vitamin B12. Not being able to take in (absorb) vitamin B12 from the food that you eat. Certain diseases. A condition in which the body does not make enough of a certain protein. This results in your body not taking in enough vitamin B12. Having a surgery in which part of the stomach or small intestine is taken out. Taking medicines that make it hard for the body to take in vitamin B12. These include: Heartburn medicines. Some medicines that are used to treat diabetes. What increases the risk? Being an older adult. Eating a vegetarian or vegan diet that does not include any foods that come from animals. Not eating enough foods that contain vitamin B12 while you are pregnant. Taking certain medicines. Having alcoholism. What are the signs or symptoms? In some cases, there  are no symptoms. If the condition leads to too few blood cells or nerve damage, symptoms can occur, such as: Feeling weak or tired. Not being hungry. Losing feeling (numbness) or tingling in your hands and feet. Redness and burning of the tongue. Feeling sad (depressed). Confusion or memory problems. Trouble walking. If anemia is very bad, symptoms can include: Being short of breath. Being dizzy. Having a very fast heartbeat. How is this treated? Changing the way you eat and drink, such as: Eating more foods that contain vitamin B12.  Drinking little or no alcohol. Getting vitamin B12 shots. Taking vitamin B12 supplements by mouth (orally). Your doctor will tell you the dose that is best for you. Follow these instructions at home: Eating and drinking  Eat foods that come from animals and have a lot of vitamin B12 in them. These include: Meats and poultry. This includes beef, pork, chicken, [place], and organ meats, such as liver. Seafood, such as clams, rainbow trout, salmon, tuna, and haddock. Eggs. Dairy foods such as milk, yogurt, and cheese. Eat breakfast cereals that have vitamin B12 added to them (are fortified). Check the label. The items listed above may not be a complete list of foods and beverages you can eat and drink. Contact a dietitian for more information. Alcohol use Do not drink alcohol if: Your doctor tells you not to drink. You are pregnant, may be pregnant, or are planning to become pregnant. If you drink alcohol: Limit how much you have to: 0-1 drink a day for women. 0-2 drinks a day for men. Know how much alcohol is in your drink. In the [place] one drink equals one 12 oz bottle of beer (355 mL), one 5 oz glass of wine (148 mL), or one 1 oz glass of hard liquor (44 mL). General instructions Get any vitamin B12 shots if told by your doctor. Take supplements only as told by your doctor. Follow the directions. Keep all follow-up visits. Contact a doctor  if: Your symptoms come back. Your symptoms get worse or do not get better with treatment. Get help right away if: You have trouble breathing. You have a very fast heartbeat. You have chest pain. You get dizzy. You faint. These symptoms may be an emergency. Get help right away. Call 911. Do not wait to see if the symptoms will go away. Do not drive yourself to the hospital. Summary Vitamin B12 deficiency means that your body is not getting enough of the vitamin. In some cases, there are no symptoms of this condition. Treatment may include making a change in the way you eat and drink, getting shots, or taking supplements. Eat foods that have vitamin B12 in them. This information is not intended to replace advice given to you by your health care provider. Make sure you discuss any questions you have with your health care provider. Document Revised: 05/07/2021 Document Reviewed: 05/07/2021 Elsevier Patient Education  2024 Else[employer] Inc. Health Maintenance After Age 49 After age 40, you are at a higher risk for certain long-term diseases and infections as well as injuries from falls. Falls are a major cause of broken bones and head injuries in people who are older than age 75. Getting regular preventive care can help to keep you healthy and well. Preventive care includes getting regular testing and making lifestyle changes as recommended by your health care provider. Talk with your health care provider about: Which screenings and tests you should have. A screening is a test that checks for a disease when you have no symptoms. A diet and exercise plan that is right for you. What should I know about screenings and tests to prevent falls? Screening and testing are the best ways to find a health problem early. Early diagnosis and treatment give you the best chance of managing medical conditions that are common after age 20. Certain conditions and lifestyle choices may make you more likely to have a  fall. Your health care provider may recommend: Regular vision checks. Poor vision and conditions such as cataracts can make you  more likely to have a fall. If you wear glasses, make sure to get your prescription updated if your vision changes. Medicine review. Work with your health care provider to regularly review all of the medicines you are taking, including over-the-counter medicines. Ask your health care provider about any side effects that may make you more likely to have a fall. Tell your health care provider if any medicines that you take make you feel dizzy or sleepy. Strength and balance checks. Your health care provider may recommend certain tests to check your strength and balance while standing, walking, or changing positions. Foot health exam. Foot pain and numbness, as well as not wearing proper footwear, can make you more likely to have a fall. Screenings, including: Osteoporosis screening. Osteoporosis is a condition that causes the bones to get weaker and break more easily. Blood pressure screening. Blood pressure changes and medicines to control blood pressure can make you feel dizzy. Depression screening. You may be more likely to have a fall if you have a fear of falling, feel depressed, or feel unable to do activities that you used to do. Alcohol use screening. Using too much alcohol can affect your balance and may make you more likely to have a fall. Follow these instructions at home: Lifestyle Do not drink alcohol if: Your health care provider tells you not to drink. If you drink alcohol: Limit how much you have to: 0-1 drink a day for women. 0-2 drinks a day for men. Know how much alcohol is in your drink. In the [place] one drink equals one 12 oz bottle of beer (355 mL), one 5 oz glass of wine (148 mL), or one 1 oz glass of hard liquor (44 mL). Do not use any products that contain nicotine or tobacco. These products include cigarettes, chewing tobacco, and vaping devices,  such as e-cigarettes. If you need help quitting, ask your health care provider. Activity  Follow a regular exercise program to stay fit. This will help you maintain your balance. Ask your health care provider what types of exercise are appropriate for you. If you need a cane or walker, use it as recommended by your health care provider. Wear supportive shoes that have nonskid soles. Safety  Remove any tripping hazards, such as rugs, cords, and clutter. Install safety equipment such as grab bars in bathrooms and safety rails on stairs. Keep rooms and walkways well-lit. General instructions Talk with your health care provider about your risks for falling. Tell your health care provider if: You fall. Be sure to tell your health care provider about all falls, even ones that seem minor. You feel dizzy, tiredness (fatigue), or off-balance. Take over-the-counter and prescription medicines only as told by your health care provider. These include supplements. Eat a healthy diet and maintain a healthy weight. A healthy diet includes low-fat dairy products, low-fat (lean) meats, and fiber from whole grains, beans, and lots of fruits and vegetables. Stay current with your vaccines. Schedule regular health, dental, and eye exams. Summary Having a healthy lifestyle and getting preventive care can help to protect your health and wellness after age 59. Screening and testing are the best way to find a health problem early and help you avoid having a fall. Early diagnosis and treatment give you the best chance for managing medical conditions that are more common for people who are older than age 51. Falls are a major cause of broken bones and head injuries in people who are older than age 83. Take  precautions to prevent a fall at home. Work with your health care provider to learn what changes you can make to improve your health and wellness and to prevent falls. This information is not intended to replace  advice given to you by your health care provider. Make sure you discuss any questions you have with your health care provider. Document Revised: 02/01/2021 Document Reviewed: 02/01/2021 Elsevier Patient Education  2024 [employer] Inc. Ear Irrigation Ear irrigation is a procedure to wash dirt and wax out of your ear canal. It's also called lavage. You may need this if you're having trouble hearing because of wax in your ear. You may also have it done as part of the treatment for an ear infection.  Getting wax and dirt out of your ear can help ear drops work better. Tell a health care provider about: Any allergies you have. All medicines you're taking, including vitamins, herbs, eye drops, creams, and over-the-counter medicines. Any problems you or family members have had with anesthesia. Any bleeding problems you have. Any surgeries you've had. This includes any ear surgeries. Any medical conditions you have, such as any problems with your ear. Whether you're pregnant or may be pregnant. What are the risks? Your health care provider will talk with you about risks. These may include: Infection. Pain. Loss of hearing. Fluid and debris being pushed into your middle ear. This can happen if there are holes in your eardrum. The procedure not working. Trauma to your ear. Feeling dizzy, light-headed, or nauseous. What happens before the procedure? You'll talk with your provider about the procedure and plan. You may be given ear drops to put in your ear 15-20 minutes before the procedure. This helps loosen the wax. What happens during the procedure?  A syringe will be filled with water or a saline solution. Saline is made of salt and water. The syringe will be gently put into your ear. The fluid will be used to wash out wax and other debris. The procedure may vary among providers and hospitals. What can I expect after the procedure? Follow the instructions given to you by your provider. Follow  these instructions at home: Using ear irrigation kits In some cases, you can use an ear irrigation kit at home. Ask your provider if this is an option for you. Use the kit only as told by your provider. Read the instructions on the package. Follow the directions for using the syringe. Use water that's at room temperature. Do not use an ear irrigation kit if: You have diabetes. This can make you more likely to get an infection. You have a hole or tear in your eardrum. You have tubes in your ears. You've had ear surgery before. You've been told not to irrigate your ears. Cleaning your ears  Clean the outside of your ear with a soft washcloth each day. If told by your provider, use a few drops of baby oil, mineral oil, glycerin, hydrogen peroxide, or earwax softening drops. Do not use cotton swabs to clean your ears. These can push wax down into the ear canal. Do not put things into your ears to try to get rid of wax. This includes ear candles. General instructions Take over-the-counter and prescription medicines only as told by your provider. If you were prescribed antibiotics, use them as told by your provider. Do not stop using the antibiotic even if you start to feel better. Keep your ear clean and dry as told by your provider. See your provider at least  once a year to have your ears and hearing checked. Contact a health care provider if: Your hearing isn't getting better. Your hearing is getting worse. You have pain or redness in your ear. You feel dizzy. You have ringing in your ears. You have nausea or vomiting. This information is not intended to replace advice given to you by your health care provider. Make sure you discuss any questions you have with your health care provider. Document Revised: 11/24/2022 Document Reviewed: 11/24/2022 Elsevier Patient Education  2024 [employer] Dyslipidemia Dyslipidemia is an imbalance of waxy, fat-like substances (lipids) in the blood.  The body needs lipids in small amounts. Dyslipidemia often involves a high level of cholesterol or triglycerides, which are types of lipids. Common forms of dyslipidemia include: High levels of LDL cholesterol. LDL is the type of cholesterol that causes fatty deposits (plaques) to build up in the blood vessels that carry blood away from the heart (arteries). Low levels of HDL cholesterol. HDL cholesterol is the type of cholesterol that protects against heart disease. High levels of HDL remove the LDL buildup from arteries. High levels of triglycerides. Triglycerides are a fatty substance in the blood that is linked to a buildup of plaques in the arteries. What are the causes? There are two main types of dyslipidemia: primary and secondary. Primary dyslipidemia is caused by changes (mutations) in genes that are passed down through families (inherited). These mutations cause several types of dyslipidemia. Secondary dyslipidemia may be caused by various risk factors that can lead to the disease, such as lifestyle choices and certain medical conditions. What increases the risk? You are more likely to develop this condition if you are an older man or if you are a woman who has gone through menopause. Other risk factors include: Having a family history of dyslipidemia. Taking certain medicines, including birth control pills, steroids, some diuretics, and beta-blockers. Eating a diet high in saturated fat. Smoking cigarettes or excessive alcohol intake. Having certain medical conditions such as diabetes, polycystic ovary syndrome (PCOS), kidney disease, liver disease, or hypothyroidism. Not exercising regularly. Being overweight or obese with too much belly fat. What are the signs or symptoms? In most cases, dyslipidemia does not usually cause any symptoms. In severe cases, very high lipid levels can cause: Fatty bumps under the skin (xanthomas). A white or gray ring around the black center (pupil) of  the eye. Very high triglyceride levels can cause inflammation of the pancreas (pancreatitis). How is this diagnosed? Your health care provider may diagnose dyslipidemia based on a routine blood test (fasting blood test). Because most people do not have symptoms of the condition, this blood testing (lipid profile) is done on adults age 94 and older and is repeated every 4-6 years. This test checks: Total cholesterol. This measures the total amount of cholesterol in your blood, including LDL cholesterol, HDL cholesterol, and triglycerides. A healthy number is below 200 mg/dL (4.82 mmol/L). LDL cholesterol. The target number for LDL cholesterol is different for each person, depending on individual risk factors. A healthy number is usually below 100 mg/dL (7.40 mmol/L). Ask your health care provider what your LDL cholesterol should be. HDL cholesterol. An HDL level of 60 mg/dL (8.44 mmol/L) or higher is best because it helps to protect against heart disease. A number below 40 mg/dL (8.96 mmol/L) for men or below 50 mg/dL (8.70 mmol/L) for women increases the risk for heart disease. Triglycerides. A healthy triglyceride number is below 150 mg/dL (8.30 mmol/L). If your lipid profile  is abnormal, your health care provider may do other blood tests. How is this treated? Treatment depends on the type of dyslipidemia that you have and your other risk factors for heart disease and stroke. Your health care provider will have a target range for your lipid levels based on this information. Treatment for dyslipidemia starts with lifestyle changes, such as diet and exercise. Your health care provider may recommend that you: Get regular exercise. Make changes to your diet. Quit smoking if you smoke. Limit your alcohol intake. If diet changes and exercise do not help you reach your goals, your health care provider may also prescribe medicine to lower lipids. The most commonly prescribed type of medicine lowers your LDL  cholesterol (statin drug). If you have a high triglyceride level, your provider may prescribe another type of drug (fibrate) or an omega-3 fish oil supplement, or both. Follow these instructions at home: Eating and drinking  Follow instructions from your health care provider or dietitian about eating or drinking restrictions. Eat a healthy diet as told by your health care provider. This can help you reach and maintain a healthy weight, lower your LDL cholesterol, and raise your HDL cholesterol. This may include: Limiting your calories, if you are overweight. Eating more fruits, vegetables, whole grains, fish, and lean meats. Limiting saturated fat, trans fat, and cholesterol. Do not drink alcohol if: Your health care provider tells you not to drink. You are pregnant, may be pregnant, or are planning to become pregnant. If you drink alcohol: Limit how much you have to: 0-1 drink a day for women. 0-2 drinks a day for men. Know how much alcohol is in your drink. In the [place] one drink equals one 12 oz bottle of beer (355 mL), one 5 oz glass of wine (148 mL), or one 1 oz glass of hard liquor (44 mL). Activity Get regular exercise. Start an exercise and strength training program as told by your health care provider. Ask your health care provider what activities are safe for you. Your health care provider may recommend: 30 minutes of aerobic activity 4-6 days a week. Brisk walking is an example of aerobic activity. Strength training 2 days a week. General instructions Do not use any products that contain nicotine or tobacco. These products include cigarettes, chewing tobacco, and vaping devices, such as e-cigarettes. If you need help quitting, ask your health care provider. Take over-the-counter and prescription medicines only as told by your health care provider. This includes supplements. Keep all follow-up visits. This is important. Contact a health care provider if: You are having trouble  sticking to your exercise or diet plan. You are struggling to quit smoking or to control your use of alcohol. Summary Dyslipidemia often involves a high level of cholesterol or triglycerides, which are types of lipids. Treatment depends on the type of dyslipidemia that you have and your other risk factors for heart disease and stroke. Treatment for dyslipidemia starts with lifestyle changes, such as diet and exercise. Your health care provider may prescribe medicine to lower lipids. This information is not intended to replace advice given to you by your health care provider. Make sure you discuss any questions you have with your health care provider. Document Revised: 04/15/2022 Document Reviewed: 11/16/2020 Elsevier Patient Education  2025 [employer]. Vitamin D Deficiency Vitamin D deficiency is when your body does not have enough vitamin D. Vitamin D is important because: It helps your body use certain minerals. It helps to keep your bones healthy. It  lessens irritation and swelling (inflammation). It helps the body's defense system (immune system) work better. Not getting enough vitamin D can make your bones soft. What are the causes? Not eating enough foods that have vitamin D in them. Not getting enough sun. Having diseases that make it hard for your body to take in vitamin D. Having had part of your stomach or part of your small intestine taken out. What increases the risk? Being an older adult. Not spending much time outdoors. Living in a long-term care center. Having dark skin. Taking certain medicines. Being overweight or very overweight (obese). Having long-term (chronic) kidney or liver disease. What are the signs or symptoms? In mild cases, there may be no symptoms. If the condition is very bad, symptoms may include: Bone pain. Muscle pain. Not being able to walk normally. Bones that break easily. Joint pain. How is this treated? Treatment may include taking  supplements as told by your doctor. Your doctor will tell you what dose is best for you. This may include taking: Vitamin D. Calcium. Follow these instructions at home: Eating and drinking Eat foods that have vitamin D in them, such as: Dairy products, cereals, or juices that have vitamin D added to them (are fortified). Check the label. Fish, such as salmon or trout. Eggs. The vitamin D is in the yolk. Mushrooms that were treated with UV light. Beef liver. The items listed above may not be a complete list of foods and beverages you can eat and drink. Contact a dietitian for more information. General instructions Take over-the-counter and prescription medicines only as told by your doctor. Take supplements only as told by your doctor. Get sunlight in a safe way. Do not use a tanning bed. Stay at a healthy weight. Lose weight if you need to. Keep all follow-up visits. How is this prevented? Eating foods that naturally have vitamin D in them. Eating or drinking foods and drinks that have vitamin D added to them, such as cereals, juices, and milk. Taking vitamin D or a multivitamin that has vitamin D in it. Being in the sun. Your body makes vitamin D when your skin gets sunlight. Contact a doctor if: Your symptoms do not go away. You feel like you may vomit (nauseous). You vomit. You poop less often than normal, or you have trouble pooping (constipation). Summary Vitamin D deficiency is when your body does not have enough vitamin D. Vitamin D helps to keep your bones healthy. This condition is often treated by taking a supplement. Your doctor will tell you what dose is best for you. This information is not intended to replace advice given to you by your health care provider. Make sure you discuss any questions you have with your health care provider. Document Revised: 06/18/2021 Document Reviewed: 06/18/2021 Elsevier Patient Education  2024 [employer]. Preventing Vitamin D  Deficiency Vitamin D deficiency is when your body doesn't have enough vitamin D. Vitamin D is important because it helps your body maintain calcium and phosphorus levels. Vitamin D is also important because: It helps keep bones and teeth healthy. It reduces irritation and swelling in the body (inflammation). It makes the body's defense system (immune system) strong. Our bodies make vitamin D when our skin is exposed to direct sunlight. But for many people, this may not be enough vitamin D to meet the body's needs. How can vitamin D deficiency affect me? If lack of vitamin D is really bad: Your bones may become soft. In adults, this is  called osteomalacia. In children, this is called rickets. Your bones may become weak or thin. This is called osteoporosis. What can increase my risk for vitamin D deficiency? You may be at risk for vitamin D deficiency if: You're pregnant. You have too much body fat (obesity). You're an older adult. You have dark skin. You take medicines that affect the way vitamin D is absorbed. You had surgery to remove a part of the stomach or a part of the small intestine. You may also be at risk if: You have a condition that makes it hard for you to absorb fat. This includes Crohn's disease, long-term (chronic) pancreatitis, or cystic fibrosis. You have certain conditions that are passed from parent to child (inherited). You don't have access to foods rich in vitamin D, or you follow a diet with little or no dairy, such as a vegan diet or lactose-free diet. You're not able to move or go outside safely. You live in areas that have fewer hours of sunlight. You spend most of your day indoors, or you cover your skin all the time when you're outdoors. What actions can I take to reduce my risk of vitamin D deficiency? Know how much vitamin D you need Different people need different amounts of vitamin D daily: Infants: 400 international units (IU). Children older than 1  year: 600 IU. Adults: 600 IU. Pregnant and breastfeeding women: 600 IU. Adults older than 70 years: 800 IU. These are the minimum levels of recommended amounts. Your health care provider may tell you to take a different amount of vitamin D based on your needs and your health. Know the best sources of vitamin D You can meet your daily vitamin D needs from: Direct exposure to natural sunlight. Foods. Dietary supplements. Infants can get their vitamin D from infant formula. Get sun exposure Get regular, safe exposure to natural sunlight. Expose your skin to direct sunlight for at least 15 minutes every day. If you have dark skin, you may need to expose your skin for a longer period of time. Protect your skin from too much sun exposure. This helps to prevent skin cancer. Ask your provider if regular sun exposure is safe for you. Do not use a tanning bed. Follow the right diet  Eat foods that naturally contain vitamin D. These include: Beef liver. Eggs. Vitamin D is in the yolk. Fish, such as salmon or trout. Mushrooms that were treated with UV light. Eat or drink products that have vitamin D added to them (fortified). These may include: Cereals. Milk, including plant-based milk such as almond, soy, or oat milks. Orange juice. Margarine. When choosing foods, check the food label on the package to see: How much vitamin D is in the item. If the food is fortified with vitamin D. The items listed above may not be all the foods and drinks that have vitamin D. Talk with an expert in healthy eating (dietitian) to learn more. Take supplements and medicines If you're at risk for vitamin D deficiency, or if you have certain diseases, your provider may recommend that you take a vitamin D supplement. Make sure you: Talk with your provider before you start taking any vitamin D supplements. You may have side effects of vitamin D supplements if you're on certain medicines or have certain medical  conditions. Take your medicines and supplments only as told. Tell your provider about all the medicines you're taking. These include vitamins, herbs, eye drops, creams, and over-the-counter medicines. To help your body to  absorb vitamin D, take your supplement with a meal or snack. This information is not intended to replace advice given to you by your health care provider. Make sure you discuss any questions you have with your health care provider. Document Revised: 12/28/2023 Document Reviewed: 12/28/2023 Elsevier Patient Education  2025 [employer].  The above assessment and management plan was discussed with the patient. The patient verbalized understanding of and has agreed to the management plan. Patient is aware to call the clinic if they develop any new symptoms or if symptoms persist or worsen. Patient is aware when to return to the clinic for a follow-up visit. Patient educated on when it is appropriate to go to the emergency department.    Rondale Nies [place] Thompson, DNP [facility] [address] [place], [place] [address] [phone]

## 2024-07-15 ENCOUNTER — Encounter: Payer: Self-pay | Admitting: Nurse Practitioner

## 2024-07-15 ENCOUNTER — Ambulatory Visit: Payer: Self-pay | Admitting: Nurse Practitioner

## 2024-07-15 VITALS — BP 129/78 | HR 63 | Temp 97.7°F | Ht 68.0 in | Wt 219.0 lb

## 2024-07-15 DIAGNOSIS — H6121 Impacted cerumen, right ear: Secondary | ICD-10-CM

## 2024-07-15 DIAGNOSIS — E782 Mixed hyperlipidemia: Secondary | ICD-10-CM | POA: Diagnosis not present

## 2024-07-15 DIAGNOSIS — R351 Nocturia: Secondary | ICD-10-CM

## 2024-07-15 DIAGNOSIS — Z0001 Encounter for general adult medical examination with abnormal findings: Secondary | ICD-10-CM | POA: Diagnosis not present

## 2024-07-15 DIAGNOSIS — E538 Deficiency of other specified B group vitamins: Secondary | ICD-10-CM

## 2024-07-15 DIAGNOSIS — E559 Vitamin D deficiency, unspecified: Secondary | ICD-10-CM | POA: Diagnosis not present

## 2024-07-15 DIAGNOSIS — R609 Edema, unspecified: Secondary | ICD-10-CM

## 2024-07-15 DIAGNOSIS — Z23 Encounter for immunization: Secondary | ICD-10-CM

## 2024-07-15 MED ORDER — FUROSEMIDE 20 MG PO TABS: ORAL_TABLET | ORAL | 0 refills | Status: DC

## 2024-07-15 MED ORDER — DEBROX 6.5 % OT SOLN: 5.0000 [drp] | Freq: Two times a day (BID) | OTIC | 1 refills | Status: DC

## 2024-07-15 NOTE — Patient Instructions (Signed)
 Gabapentin weaning off schedule  gabapentin taper:  - reduce to one tablet daily for two weeks -  then one tablet every other day for two weeks -  then one tablet every three days for two weeks, then discontinue

## 2024-10-06 ENCOUNTER — Other Ambulatory Visit: Payer: Self-pay | Admitting: Nurse Practitioner

## 2024-10-06 DIAGNOSIS — R609 Edema, unspecified: Secondary | ICD-10-CM

## 2024-10-15 ENCOUNTER — Ambulatory Visit (INDEPENDENT_AMBULATORY_CARE_PROVIDER_SITE_OTHER): Payer: Self-pay | Admitting: Nurse Practitioner

## 2024-10-15 ENCOUNTER — Encounter: Payer: Self-pay | Admitting: Nurse Practitioner

## 2024-10-15 VITALS — BP 120/76 | HR 85 | Temp 98.6°F | Resp 16 | Ht 68.0 in | Wt 217.0 lb

## 2024-10-15 DIAGNOSIS — E782 Mixed hyperlipidemia: Secondary | ICD-10-CM

## 2024-10-15 DIAGNOSIS — E6609 Other obesity due to excess calories: Secondary | ICD-10-CM | POA: Diagnosis not present

## 2024-10-15 DIAGNOSIS — Z0001 Encounter for general adult medical examination with abnormal findings: Secondary | ICD-10-CM

## 2024-10-15 DIAGNOSIS — E66811 Obesity, class 1: Secondary | ICD-10-CM | POA: Insufficient documentation

## 2024-10-15 DIAGNOSIS — E559 Vitamin D deficiency, unspecified: Secondary | ICD-10-CM

## 2024-10-15 DIAGNOSIS — K219 Gastro-esophageal reflux disease without esophagitis: Secondary | ICD-10-CM | POA: Diagnosis not present

## 2024-10-15 DIAGNOSIS — G8929 Other chronic pain: Secondary | ICD-10-CM | POA: Diagnosis not present

## 2024-10-15 DIAGNOSIS — E538 Deficiency of other specified B group vitamins: Secondary | ICD-10-CM | POA: Diagnosis not present

## 2024-10-15 DIAGNOSIS — M545 Low back pain, unspecified: Secondary | ICD-10-CM | POA: Diagnosis not present

## 2024-10-15 DIAGNOSIS — Z6832 Body mass index (BMI) 32.0-32.9, adult: Secondary | ICD-10-CM

## 2024-10-15 MED ORDER — ATORVASTATIN CALCIUM 10 MG PO TABS: 10.0000 mg | ORAL_TABLET | Freq: Every day | ORAL | 1 refills | Status: AC

## 2024-10-15 MED ORDER — VITAMIN B-12 1000 MCG PO TABS: 1000.0000 ug | ORAL_TABLET | Freq: Every day | ORAL | 1 refills | Status: AC

## 2024-10-15 MED ORDER — MELOXICAM 15 MG PO TABS: 15.0000 mg | ORAL_TABLET | Freq: Every day | ORAL | 1 refills | Status: AC

## 2024-10-15 NOTE — Progress Notes (Signed)
 "    Subjective:  Patient ID: Tyler Boone, male    DOB: February 27, 1951, 74 y.o.   MRN: [id number]  Patient Care Team: Deitra Morton Sebastian Nena, NP as PCP - General (Nurse Practitioner)   Chief Complaint:  Medical Management of Chronic Issues   HPI: Tyler Boone is a 74 y.o. male presenting on 10/15/2024 for Medical Management of Chronic Issues   Discussed the use of AI scribe software for clinical note transcription with the patient, who gave verbal consent to proceed.  History of Present Illness Tyler Boone is a 74 year old male who presents for a three-month follow-up for chronic disease management.  He has not experienced any significant changes in his condition since the last visit. He discontinued gabapentin and notes no difference in his symptoms since stopping the medication.  He has chronic back pain and is currently taking meloxicam  for pain management. He is unsure of the medication's effects but is willing to continue its use.  He has a history of GERD, mixed hyperlipidemia, and vitamin B12 deficiency. He continues to take a B12 supplement and Lipitor 10 mg daily.  He was previously on furosemide  20 mg daily for bilateral lower extremity edema and has discontinued its use.  No current concerns. No bowel movement issues and reports going to the bathroom regularly.       10/15/2024    2:59 PM 07/15/2024    2:59 PM  PHQ9 SCORE ONLY  PHQ-9 Total Score 0 0       10/15/2024    2:59 PM 07/15/2024    3:01 PM  GAD 7 : Generalized Anxiety Score  Nervous, Anxious, on Edge 0 0   Control/stop worrying 0 0   Worry too much - different things 0 0   Trouble relaxing 0 0   Restless 0 0   Easily annoyed or irritable 0 0   Afraid - awful might happen 0 0   Total GAD 7 Score 0 0  Anxiety Difficulty Not difficult at all Not difficult at all     Data saved with a previous flowsheet row definition      Relevant past medical, surgical, family, and social history reviewed  and updated as indicated.  Allergies and medications reviewed and updated. Data reviewed: Chart in Epic.   Past Medical History:  Diagnosis Date   GERD (gastroesophageal reflux disease)     Past Surgical History:  Procedure Laterality Date   APPENDECTOMY     CATARACT EXTRACTION W/PHACO  12/29/2011   Procedure: CATARACT EXTRACTION PHACO AND INTRAOCULAR LENS PLACEMENT (IOC);  Surgeon: Cherene Mania, MD;  Location: [facility];  Service: Ophthalmology;  Laterality: Right;  CDE: 7.32   EYE SURGERY     left KPE w/IOL   HERNIA REPAIR     right inguinal   left thumb surgery     pinning    Social History   Socioeconomic History   Marital status: Single    Spouse name: Not on file   Number of children: Not on file   Years of education: Not on file   Highest education level: Not on file  Occupational History   Not on file  Tobacco Use   Smoking status: Former    Types: Cigarettes   Smokeless tobacco: Not on file  Substance and Sexual Activity   Alcohol use: Yes    Comment: occ.   Drug use: No   Sexual activity: Not on file  Other Topics Concern  Not on file  Social History Narrative   Not on file   Social Drivers of Health   Tobacco Use: Medium Risk (10/15/2024)   Patient History    Smoking Tobacco Use: Former    Smokeless Tobacco Use: Unknown    Passive Exposure: Not on file  Financial Resource Strain: Low Risk (03/22/2023)   Received from [facility]   Overall Financial Resource Strain (CARDIA)    Difficulty of Paying Living Expenses: Not hard at all  Food Insecurity: No Food Insecurity (02/23/2024)   Received from [facility]   Epic    Within the past 12 months, you worried that your food would run out before you got the money to buy more.: Never true    Within the past 12 months, the food you bought just didn't last and you didn't have money to get more.: Never true  Transportation Needs: No Transportation Needs (02/23/2024)   Received from [facility] - Transportation    Lack of Transportation (Medical): No    Lack of Transportation (Non-Medical): No  Physical Activity: Sufficiently Active (02/23/2024)   Received from [facility]   Exercise Vital Sign    On average, how many days per week do you engage in moderate to strenuous exercise (like a brisk walk)?: 7 days    On average, how many minutes do you engage in exercise at this level?: 60 min  Stress: No Stress Concern Present (03/22/2023)   Received from [facility] - Occupational Stress Questionnaire    Feeling of Stress : Not at all  Social Connections: Moderately Isolated (02/23/2024)   Received from [facility]   Social Connection and Isolation Panel    In a typical week, how many times do you talk on the phone with family, friends, or neighbors?: Three times a week    How often do you get together with friends or relatives?: Three times a week    How often do you attend church or religious services?: More than 4 times per year    Do you belong to any clubs or organizations such as church groups, unions, fraternal or athletic groups, or school groups?: No    How often do you attend meetings of the clubs or organizations you belong to?: Never    Are you married, widowed, divorced, separated, never married, or living with a partner?: Divorced  Intimate Partner Violence: Not At Risk (02/23/2024)   Received from [facility]   Epic    Within the last year, have you been afraid of your partner or ex-partner?: No    Within the last year, have you been humiliated or emotionally abused in other ways by your partner or ex-partner?: No    Within the last year, have you been kicked, hit, slapped, or otherwise physically hurt by your partner or ex-partner?: No    Within the last year, have you been raped or forced to have any kind of sexual activity by your partner or ex-partner?: No  Depression (PHQ2-9): Low Risk (10/15/2024)    Depression (PHQ2-9)    PHQ-2 Score: 0  Alcohol Screen: Not on file  Housing: Not on file  Utilities: Low Risk (02/23/2024)   Received from [facility]   Utilities    Within the past 12 months, have you been unable to get utilities(heat, electricity) when it was really needed?: No  Health Literacy: Low Risk (02/23/2024)  Received from [facility] Literacy    How often do you need to have someone help you when you read instructions, pamphlets, or other written material from your doctor or pharmacy?: Never    Outpatient Encounter Medications as of 10/15/2024  Medication Sig   cholecalciferol (VITAMIN D ) 400 UNITS TABS Take 400 Units by mouth.   [DISCONTINUED] atorvastatin  (LIPITOR) 10 MG tablet Take 10 mg by mouth daily.   [DISCONTINUED] carbamide peroxide (DEBROX) 6.5 % OTIC solution Place 5 drops into the right ear 2 (two) times daily.   [DISCONTINUED] furosemide  (LASIX ) 20 MG tablet TAKE 1 TABLET DAILY FOR 7 DAYS, USE AS NEEDED 1-2 TIMES WEEKLY   [DISCONTINUED] meloxicam  (MOBIC ) 15 MG tablet Take 15 mg by mouth daily.   [DISCONTINUED] vitamin B-12 (CYANOCOBALAMIN) 1000 MCG tablet Take 1,000 mcg by mouth daily.   atorvastatin  (LIPITOR) 10 MG tablet Take 1 tablet (10 mg total) by mouth daily.   cyanocobalamin (VITAMIN B12) 1000 MCG tablet Take 1 tablet (1,000 mcg total) by mouth daily.   meloxicam  (MOBIC ) 15 MG tablet Take 1 tablet (15 mg total) by mouth daily.   [DISCONTINUED] gabapentin (NEURONTIN) 300 MG capsule Take 300 mg by mouth 2 (two) times daily. (Patient not taking: Reported on 10/15/2024)   No facility-administered encounter medications on file as of 10/15/2024.    Allergies[1]  Pertinent ROS per HPI, otherwise unremarkable      Objective:  BP 120/76   Pulse 85   Temp 98.6 F (37 C)   Resp 16   Ht 5' 8 (1.727 m)   Wt 217 lb (98.4 kg)   SpO2 96%   BMI 32.99 kg/m    Wt Readings from Last 3 Encounters:  10/15/24 217 lb (98.4 kg)  07/15/24 219 lb  (99.3 kg)  12/28/11 174 lb (78.9 kg)   BP Readings from Last 3 Encounters:  10/15/24 120/76  07/15/24 129/78  07/15/16 (!) 148/93     Physical Exam Vitals and nursing note reviewed.  Constitutional:      General: He is not in acute distress.    Appearance: He is obese.  HENT:     Head: Normocephalic and atraumatic.     Nose: Nose normal.  Eyes:     General: No scleral icterus.    Extraocular Movements: Extraocular movements intact.     Conjunctiva/sclera: Conjunctivae normal.     Pupils: Pupils are equal, round, and reactive to light.  Neck:     Vascular: No carotid bruit.  Pulmonary:     Effort: Pulmonary effort is normal.     Breath sounds: Normal breath sounds.  Abdominal:     General: Bowel sounds are normal.     Palpations: Abdomen is soft.  Musculoskeletal:        General: Normal range of motion.     Cervical back: Normal range of motion and neck supple. No rigidity or tenderness.     Right lower leg: No edema.     Left lower leg: No edema.  Lymphadenopathy:     Cervical: No cervical adenopathy.  Skin:    General: Skin is warm and dry.     Findings: No lesion or rash.  Neurological:     Mental Status: He is alert and oriented to person, place, and time.     Gait: Gait is intact.  Psychiatric:        Mood and Affect: Mood normal.        Behavior: Behavior normal.  Thought Content: Thought content normal.        Judgment: Judgment normal.    Physical Exam      Results for orders placed or performed during the hospital encounter of 08/09/13  I-STAT creatinine   Collection Time: 08/09/13 10:43 AM  Result Value Ref Range   Creatinine, Ser 1.10 0.50 - 1.35 mg/dL       Pertinent labs & imaging results that were available during my care of the patient were reviewed by me and considered in my medical decision making.  Assessment & Plan:  Tyler Boone was seen today for medical management of chronic issues.  Diagnoses and all orders for this  visit:  Gastroesophageal reflux disease without esophagitis -     CBC with Differential/Platelet -     CMP14+EGFR  Mixed hyperlipidemia -     Lipid panel -     Thyroid  Panel With TSH -     atorvastatin  (LIPITOR) 10 MG tablet; Take 1 tablet (10 mg total) by mouth daily.  Vitamin D  deficiency -     VITAMIN D  25 Hydroxy (Vit-D Deficiency, Fractures)  B12 deficiency -     B12 and Folate Panel -     cyanocobalamin (VITAMIN B12) 1000 MCG tablet; Take 1 tablet (1,000 mcg total) by mouth daily.  Class 1 obesity due to excess calories with serious comorbidity and body mass index (BMI) of 32.0 to 32.9 in adult  Encounter for general adult medical examination with abnormal findings -     CBC with Differential/Platelet -     CMP14+EGFR -     Lipid panel -     Thyroid  Panel With TSH -     B12 and Folate Panel -     VITAMIN D  25 Hydroxy (Vit-D Deficiency, Fractures)  Chronic low back pain without sciatica, unspecified back pain laterality -     meloxicam  (MOBIC ) 15 MG tablet; Take 1 tablet (15 mg total) by mouth daily.     Assessment and Plan Tyler Boone is a 74 year old Caucasian male seen today for chronic disease management, no acute distress Assessment & Plan Vitamin B12 deficiency - Ordered B12 level to assess current supplementation adequacy.  Mixed hyperlipidemia Managed with atorvastatin  10 mg daily.  Chronic low back pain Managed with meloxicam . - Continue meloxicam  for pain management.  Gastroesophageal reflux disease No new concerns reported.  General Health Maintenance Routine follow-up and medication management discussed. - Scheduled follow-up appointment in six months.   Labs CBC, CMP, lipid, TSH, B12, vitamin D  result pending  Continue all other maintenance medications.  Follow up plan: Return in about 6 months (around 04/14/2025) for Chronic Diseases Management.   Continue healthy lifestyle choices, including diet (rich in fruits, vegetables, and lean  proteins, and low in salt and simple carbohydrates) and exercise (at least 30 minutes of moderate physical activity daily).  Educational handout given for   Clinical References  Vitamin B12 Deficiency Vitamin B12 deficiency means that your body does not have enough vitamin B12. The body needs this important vitamin: To make red blood cells. To make genes (DNA). To help the nerves work. If you do not have enough vitamin B12 in your body, you can have health problems, such as not having enough red blood cells in the blood (anemia). What are the causes? Not eating enough foods that contain vitamin B12. Not being able to take in (absorb) vitamin B12 from the food that you eat. Certain diseases. A condition in which the body does not make enough  of a certain protein. This results in your body not taking in enough vitamin B12. Having a surgery in which part of the stomach or small intestine is taken out. Taking medicines that make it hard for the body to take in vitamin B12. These include: Heartburn medicines. Some medicines that are used to treat diabetes. What increases the risk? Being an older adult. Eating a vegetarian or vegan diet that does not include any foods that come from animals. Not eating enough foods that contain vitamin B12 while you are pregnant. Taking certain medicines. Having alcoholism. What are the signs or symptoms? In some cases, there are no symptoms. If the condition leads to too few blood cells or nerve damage, symptoms can occur, such as: Feeling weak or tired. Not being hungry. Losing feeling (numbness) or tingling in your hands and feet. Redness and burning of the tongue. Feeling sad (depressed). Confusion or memory problems. Trouble walking. If anemia is very bad, symptoms can include: Being short of breath. Being dizzy. Having a very fast heartbeat. How is this treated? Changing the way you eat and drink, such as: Eating more foods that contain  vitamin B12. Drinking little or no alcohol. Getting vitamin B12 shots. Taking vitamin B12 supplements by mouth (orally). Your doctor will tell you the dose that is best for you. Follow these instructions at home: Eating and drinking  Eat foods that come from animals and have a lot of vitamin B12 in them. These include: Meats and poultry. This includes beef, pork, chicken, turkey, and organ meats, such as liver. Seafood, such as clams, rainbow trout, salmon, tuna, and haddock. Eggs. Dairy foods such as milk, yogurt, and cheese. Eat breakfast cereals that have vitamin B12 added to them (are fortified). Check the label. The items listed above may not be a complete list of foods and beverages you can eat and drink. Contact a dietitian for more information. Alcohol use Do not drink alcohol if: Your doctor tells you not to drink. You are pregnant, may be pregnant, or are planning to become pregnant. If you drink alcohol: Limit how much you have to: 0-1 drink a day for women. 0-2 drinks a day for men. Know how much alcohol is in your drink. In the [place] one drink equals one 12 oz bottle of beer (355 mL), one 5 oz glass of wine (148 mL), or one 1 oz glass of hard liquor (44 mL). General instructions Get any vitamin B12 shots if told by your doctor. Take supplements only as told by your doctor. Follow the directions. Keep all follow-up visits. Contact a doctor if: Your symptoms come back. Your symptoms get worse or do not get better with treatment. Get help right away if: You have trouble breathing. You have a very fast heartbeat. You have chest pain. You get dizzy. You faint. These symptoms may be an emergency. Get help right away. Call 911. Do not wait to see if the symptoms will go away. Do not drive yourself to the hospital. Summary Vitamin B12 deficiency means that your body is not getting enough of the vitamin. In some cases, there are no symptoms of this condition. Treatment  may include making a change in the way you eat and drink, getting shots, or taking supplements. Eat foods that have vitamin B12 in them. This information is not intended to replace advice given to you by your health care provider. Make sure you discuss any questions you have with your health care provider. Document Revised: 05/07/2021 Document  Reviewed: 05/07/2021 Elsevier Patient Education  2024 [employer] Chronic Back Pain Chronic back pain is back pain that lasts longer than 3 months. The pain may get worse at certain times (flare-ups). There are things you can do at home to manage your pain. Follow these instructions at home: Watch for any changes in your symptoms. Take these actions to help with your pain: Managing pain and stiffness     If told, put ice on the painful area. You may be told to use ice for 24-48 hours after a flare-up starts. Put ice in a plastic bag. Place a towel between your skin and the bag. Leave the ice on for 20 minutes, 2-3 times a day. If told, put heat on the painful area. Do this as often as told by your doctor. Use the heat source that your doctor recommends, such as a moist heat pack or a heating pad. Place a towel between your skin and the heat source. Leave the heat on for 20-30 minutes. If your skin turns bright red, take off the ice or heat right away to prevent skin damage. The risk of damage is higher if you cannot feel pain, heat, or cold. Soak in a warm bath. This can help with pain. Activity        Avoid bending and other activities that make the pain worse. When you stand: Keep your upper back and neck straight. Keep your shoulders pulled back. Avoid slouching. When you sit: Keep your back straight. Relax your shoulders. Do not round your shoulders or pull them backward. Do not sit or stand in one place for too long. Take short rest breaks during the day. Lying down or standing is often better than sitting. Resting can help relieve  pain. When sitting or lying down for a long time, do some mild activity or stretching. This will help to prevent stiffness and pain. Get regular exercise. Ask your doctor what activities are safe for you. You may have to avoid lifting. Ask your provider how much you can safely lift. If you lift things: Bend your knees. Keep the weight close to your body. Avoid twisting. Medicines Take over-the-counter and prescription medicines only as told by your doctor. You may need to take medicines for pain and swelling. These may be taken by mouth or put on the skin. You may also be given muscle relaxants. Ask your doctor if the medicine prescribed to you: Requires you to avoid driving or using machinery. Can cause trouble pooping (constipation). You may need to take these actions to prevent or treat trouble pooping: Drink enough fluid to keep your pee (urine) pale yellow. Take over-the-counter or prescription medicines. Eat foods that are high in fiber. These include beans, whole grains, and fresh fruits and vegetables. Limit foods that are high in fat and sugars. These include fried or sweet foods. General instructions  Sleep on a firm mattress. Try lying on your side with your knees slightly bent. If you lie on your back, put a pillow under your knees. Do not smoke or use any products that contain nicotine or tobacco. If you need help quitting, ask your doctor. Contact a doctor if: Your pain does not get better with rest or medicine. You have new pain. You have a fever. You lose weight quickly. You have trouble doing your normal activities. One or both of your legs or feet feel weak. One or both of your legs or feet lose feeling (have numbness). Get help right away if:  You are not able to control when you pee or poop. You have bad back pain and: You feel like you may vomit (nauseous). You vomit. You have pain in your chest or your belly (abdomen). You have shortness of breath. You  faint. These symptoms may be an emergency. Get help right away. Call 911. Do not wait to see if the symptoms will go away. Do not drive yourself to the hospital. This information is not intended to replace advice given to you by your health care provider. Make sure you discuss any questions you have with your health care provider. Document Revised: 05/02/2022 Document Reviewed: 05/02/2022 Elsevier Patient Education  2024 [employer] Managing Chronic Back Pain Chronic back pain is pain that lasts longer than 3 months. It often affects the lower back. It may feel like a muscle ache or a sharp, stabbing pain. It can be mild, moderate, or severe. There are things you can do to help manage your pain. See what works best for you. Your health care provider may also give you other instructions. What actions can I take to manage my chronic back pain? You may be given a treatment plan by your provider. Treatment often starts with rest and pain relief. It may also include: Physical therapy. These are exercises to help restore movement and strength to your back. Techniques to help you relax. Counseling or therapy. Cognitive behavioral therapy (CBT) is a form of therapy that helps you set goals and make changes. Acupuncture or massage therapy. Local electrical stimulation. Injections. You may be given medicines to numb an area or relieve pain. If other treatments do not help, you may need surgery. How to use body mechanics and posture to help with pain You can help relieve stress on your back with good posture and healthy body mechanics. Body mechanics are all the ways your body moves during the day. Posture is part of body mechanics. Good posture means: Your spine is in its correct S-curve, or neutral, position. Your shoulders are pulled back a bit. Your head is not tipped forward. To improve your posture and body mechanics, follow these guidelines. Standing  When standing, keep your feet about  hip-width apart. Keep your knees slightly bent. Your ears, shoulders, and hips should line up. Your spine should be neutral. When you stand in one place for a long time, place one foot on a stable object that is 2-4 inches (5-10 cm) high, such as a footstool. Sitting  When sitting, keep your feet flat on the floor. Use a footrest, if needed. Keep your thighs parallel to the floor. Try not to round your shoulders or tilt your head forward. When working at a desk or a computer: Position your desk so your hands are a little lower than your elbows. Slide your chair under your desk so you are close enough to have good posture. Position your monitor so you are looking straight ahead and do not have to tilt your head to view the screen. Lifting  Keep your feet shoulder-width apart. Tighten the muscles of your abdomen. Bend your knees and hips. Keep your spine neutral. Lift using the strength of your legs, not your back. Do not lock your knees straight out. Ask for help to lift heavy or awkward objects. Resting  Do not lie down in a way that causes pain. If you have pain when you sit, bend, stoop, or squat, lie in a way that your body does not bend much. Try not to curl up  on your side with your arms and knees near your chest (fetal position). If it hurts to stand for a long time or reach with your arms, lie with your spine neutral and knees bent slightly. Try lying: On your side with a pillow between your knees. On your back with a pillow under your knees. How to recognize changes in your chronic back pain Let your provider know if your pain gets worse or does not get better with treatment. Your back pain may be getting worse if you have pain that: Starts to cause problems with your posture. Gets worse when you sit, stand, walk, bend, or lift things. Happens when you are active, at rest, or both. Makes it hard for you to move around (limits mobility). Occurs with fever, weight loss, or trouble  peeing (urinating). Causes numbness and tingling. Follow these instructions at home: Medicines You may need to take medicines for pain and inflammation. These may be taken by mouth or put on the skin. You may also be given muscle relaxants. Take over-the-counter and prescription medicines only as told by your provider. Ask your provider if the medicine prescribed to you: Requires you to avoid driving or using machinery. Can cause constipation. You may need to take these actions to prevent or treat constipation: Drink enough fluid to keep your pee (urine) pale yellow. Take over-the-counter or prescription medicines. Eat foods that are high in fiber, such as beans, whole grains, and fresh fruits and vegetables. Limit foods that are high in fat and processed sugars, such as fried or sweet foods. Lifestyle Do not use any products that contain nicotine or tobacco. These products include cigarettes, chewing tobacco, and vaping devices, such as e-cigarettes. If you need help quitting, ask your provider. Eat a healthy diet. Eat lots of vegetables, fruits, fish, and lean meats. Work with your provider to stay at a healthy weight. General instructions Get regular exercise as told. Exercise can help with flexibility and strength. If physical therapy was prescribed, do exercises as told by your provider. Use ice or heat therapy as told by your provider. Where can I get support? Think about joining a support group for people with chronic back pain. You can find some groups at: Pain Connection Program: painconnection.org The American Chronic Pain Association: acpanow.com Contact a health care provider if: Your pain does not get better with rest or medicine. You have new pain. You have a fever. You lose weight quickly. You have trouble doing your normal activities. You feel weak or numb in one or both of your legs or feet. Get help right away if: You are not able to control when you pee or  poop. You have severe back pain and: Nausea or vomiting. Pain in your chest or abdomen. Shortness of breath. You faint. These symptoms may be an emergency. Get help right away. Call 911. Do not wait to see if the symptoms will go away. Do not drive yourself to the hospital. This information is not intended to replace advice given to you by your health care provider. Make sure you discuss any questions you have with your health care provider. Document Revised: 05/02/2022 Document Reviewed: 05/02/2022 Elsevier Patient Education  2024 Else[employer] Inc. GERD in Adults: Diet Changes When you have gastroesophageal reflux disease (GERD), you may need to make changes to your diet. Choosing the right foods can help with your symptoms. Think about working with an expert in healthy eating called a dietitian. They can help you make healthy food choices.  What are tips for following this plan? Reading food labels Look for foods that are low in saturated fat. Foods that may help with your symptoms include: Foods with less than 5% of daily value (DV) of fat. Foods with 0 grams of trans fat. Cooking [employer] in ways that don't use a lot of fat. These ways include: Baking. Steaming. Grilling. Broiling. To add flavor, try to use herbs that are low in spice and acidity. Avoid frying your food. Meal planning  Eat small meals often rather than eating 3 large meals each day. Eat your meals slowly in a place where you feel relaxed. If told by your health care provider, avoid: Foods that cause symptoms. Keep a food diary to keep track of foods that cause symptoms. Alcohol. Drinking a lot of liquid with meals. General instructions For 2-3 hours after you eat, avoid: Bending over. Exercise. Lying down. Chew sugar-free gum after meals. What foods should I eat? Eat a healthy diet. Try to include: Foods with high amounts of fiber. These include: Fruits and vegetables. Whole grains and  beans. Low-fat dairy products. Lean meats, fish, and poultry. Egg whites. Foods that cause symptoms in someone else may not cause symptoms for you. Work with your provider to find foods that are safe for you. The items listed above may not be all the foods and drinks you can have. Talk with a dietitian to learn more. The items listed above may not be a complete list of foods and beverages you can eat and drink. Contact a dietitian for more information. What foods should I avoid? Limiting some of these foods may help with your symptoms. Each person is different. Talk with a dietitian or your provider to help you find the exact foods to avoid. Some of the foods to avoid may include: Fruits Fruits with a lot of acid in them. These may include citrus fruits, such as oranges, grapefruit, pineapple, and lemons. Vegetables Deep-fried vegetables, such as French fries. Vegetables, sauces, or toppings made with added fat and vegetables with acid in them. These may include tomatoes and tomato products, chili peppers, onions, garlic, and horseradish. Grains Pastries or quick breads with added fat. Meats and other proteins High-fat meats, such as fatty beef or pork, hot dogs, ribs, ham, sausage, salami, and bacon. Fried meat or protein, such as fried fish and fried chicken. Egg yolks. Fats and oils Butter. Margarine. Shortening. Ghee. Drinks Coffee and other drinks with caffeine in them. Fizzy and sugary drinks, such as soda and energy drinks. Fruit juice made with acidic fruits, such as orange or grapefruit. Tomato juice. Sweets and desserts Chocolate and cocoa. Donuts. Seasonings and condiments Mint, such as peppermint and spearmint. Condiments, herbs, or seasonings that cause symptoms. These may include curry, hot sauce, or vinegar-based salad dressings. The items listed above may not be all the foods and drinks you should avoid. Talk with a dietitian to learn more. Questions to ask your health  care provider Changes to your diet and everyday life are often the first steps taken to manage symptoms of GERD. If these changes don't help, talk with your provider about taking medicines. Where to find more information International Foundation for Gastrointestinal Disorders: aboutgerd.org This information is not intended to replace advice given to you by your health care provider. Make sure you discuss any questions you have with your health care provider. Document Revised: 07/25/2023 Document Reviewed: 02/08/2023 Elsevier Patient Education  2024 Else[employer] Inc. Dyslipidemia Dyslipidemia is an imbalance of  waxy, fat-like substances (lipids) in the blood. The body needs lipids in small amounts. Dyslipidemia often involves a high level of cholesterol or triglycerides, which are types of lipids. Common forms of dyslipidemia include: High levels of LDL cholesterol. LDL is the type of cholesterol that causes fatty deposits (plaques) to build up in the blood vessels that carry blood away from the heart (arteries). Low levels of HDL cholesterol. HDL cholesterol is the type of cholesterol that protects against heart disease. High levels of HDL remove the LDL buildup from arteries. High levels of triglycerides. Triglycerides are a fatty substance in the blood that is linked to a buildup of plaques in the arteries. What are the causes? There are two main types of dyslipidemia: primary and secondary. Primary dyslipidemia is caused by changes (mutations) in genes that are passed down through families (inherited). These mutations cause several types of dyslipidemia. Secondary dyslipidemia may be caused by various risk factors that can lead to the disease, such as lifestyle choices and certain medical conditions. What increases the risk? You are more likely to develop this condition if you are an older man or if you are a woman who has gone through menopause. Other risk factors include: Having a family history  of dyslipidemia. Taking certain medicines, including birth control pills, steroids, some diuretics, and beta-blockers. Eating a diet high in saturated fat. Smoking cigarettes or excessive alcohol intake. Having certain medical conditions such as diabetes, polycystic ovary syndrome (PCOS), kidney disease, liver disease, or hypothyroidism. Not exercising regularly. Being overweight or obese with too much belly fat. What are the signs or symptoms? In most cases, dyslipidemia does not usually cause any symptoms. In severe cases, very high lipid levels can cause: Fatty bumps under the skin (xanthomas). A white or gray ring around the black center (pupil) of the eye. Very high triglyceride levels can cause inflammation of the pancreas (pancreatitis). How is this diagnosed? Your health care provider may diagnose dyslipidemia based on a routine blood test (fasting blood test). Because most people do not have symptoms of the condition, this blood testing (lipid profile) is done on adults age 86 and older and is repeated every 4-6 years. This test checks: Total cholesterol. This measures the total amount of cholesterol in your blood, including LDL cholesterol, HDL cholesterol, and triglycerides. A healthy number is below 200 mg/dL (4.82 mmol/L). LDL cholesterol. The target number for LDL cholesterol is different for each person, depending on individual risk factors. A healthy number is usually below 100 mg/dL (7.40 mmol/L). Ask your health care provider what your LDL cholesterol should be. HDL cholesterol. An HDL level of 60 mg/dL (8.44 mmol/L) or higher is best because it helps to protect against heart disease. A number below 40 mg/dL (8.96 mmol/L) for men or below 50 mg/dL (8.70 mmol/L) for women increases the risk for heart disease. Triglycerides. A healthy triglyceride number is below 150 mg/dL (8.30 mmol/L). If your lipid profile is abnormal, your health care provider may do other blood tests. How is  this treated? Treatment depends on the type of dyslipidemia that you have and your other risk factors for heart disease and stroke. Your health care provider will have a target range for your lipid levels based on this information. Treatment for dyslipidemia starts with lifestyle changes, such as diet and exercise. Your health care provider may recommend that you: Get regular exercise. Make changes to your diet. Quit smoking if you smoke. Limit your alcohol intake. If diet changes and exercise do not  help you reach your goals, your health care provider may also prescribe medicine to lower lipids. The most commonly prescribed type of medicine lowers your LDL cholesterol (statin drug). If you have a high triglyceride level, your provider may prescribe another type of drug (fibrate) or an omega-3 fish oil supplement, or both. Follow these instructions at home: Eating and drinking  Follow instructions from your health care provider or dietitian about eating or drinking restrictions. Eat a healthy diet as told by your health care provider. This can help you reach and maintain a healthy weight, lower your LDL cholesterol, and raise your HDL cholesterol. This may include: Limiting your calories, if you are overweight. Eating more fruits, vegetables, whole grains, fish, and lean meats. Limiting saturated fat, trans fat, and cholesterol. Do not drink alcohol if: Your health care provider tells you not to drink. You are pregnant, may be pregnant, or are planning to become pregnant. If you drink alcohol: Limit how much you have to: 0-1 drink a day for women. 0-2 drinks a day for men. Know how much alcohol is in your drink. In the [place] one drink equals one 12 oz bottle of beer (355 mL), one 5 oz glass of wine (148 mL), or one 1 oz glass of hard liquor (44 mL). Activity Get regular exercise. Start an exercise and strength training program as told by your health care provider. Ask your health care  provider what activities are safe for you. Your health care provider may recommend: 30 minutes of aerobic activity 4-6 days a week. Brisk walking is an example of aerobic activity. Strength training 2 days a week. General instructions Do not use any products that contain nicotine or tobacco. These products include cigarettes, chewing tobacco, and vaping devices, such as e-cigarettes. If you need help quitting, ask your health care provider. Take over-the-counter and prescription medicines only as told by your health care provider. This includes supplements. Keep all follow-up visits. This is important. Contact a health care provider if: You are having trouble sticking to your exercise or diet plan. You are struggling to quit smoking or to control your use of alcohol. Summary Dyslipidemia often involves a high level of cholesterol or triglycerides, which are types of lipids. Treatment depends on the type of dyslipidemia that you have and your other risk factors for heart disease and stroke. Treatment for dyslipidemia starts with lifestyle changes, such as diet and exercise. Your health care provider may prescribe medicine to lower lipids. This information is not intended to replace advice given to you by your health care provider. Make sure you discuss any questions you have with your health care provider. Document Revised: 04/15/2022 Document Reviewed: 11/16/2020 Elsevier Patient Education  2025 Else[employer] Inc. Preventing High Cholesterol Cholesterol is a white, waxy substance similar to fat that the human body needs to help build cells. The liver makes all the cholesterol that a person's body needs. Having high cholesterol (hypercholesterolemia) increases your risk for heart disease and stroke. Extra or excess cholesterol comes from the food that you eat. High cholesterol can often be prevented with diet and lifestyle changes. If you already have high cholesterol, you can control it with diet,  lifestyle changes, and medicines. How can high cholesterol affect me? If you have high cholesterol, fatty deposits (plaques) may build up on the walls of your blood vessels. The blood vessels that carry blood away from your heart are called arteries. Plaques make the arteries narrower and stiffer. This in turn can: Restrict or  block blood flow and cause blood clots to form. Increase your risk for heart attack and stroke. What can increase my risk for high cholesterol? This condition is more likely to develop in people who: Eat foods that are high in saturated fat or cholesterol. Saturated fat is mostly found in foods that come from animal sources. Are overweight. Are not getting enough exercise. Use products that contain nicotine or tobacco, such as cigarettes, e-cigarettes, and chewing tobacco. Have a family history of high cholesterol (familial hypercholesterolemia). What actions can I take to prevent this? Nutrition  Eat less saturated fat. Avoid trans fats (partially hydrogenated oils). These are often found in margarine and in some baked goods, fried foods, and snacks bought in packages. Avoid precooked or cured meat, such as bacon, sausages, or meat loaves. Avoid foods and drinks that have added sugars. Eat more fruits, vegetables, and whole grains. Choose healthy sources of protein, such as fish, poultry, lean cuts of red meat, beans, peas, lentils, and nuts. Choose healthy sources of fat, such as: Nuts. Vegetable oils, especially olive oil. Fish that have healthy fats, such as omega-3 fatty acids. These fish include mackerel or salmon. Lifestyle Lose weight if you are overweight. Maintaining a healthy body mass index (BMI) can help prevent or control high cholesterol. It can also lower your risk for diabetes and high blood pressure. Ask your health care provider to help you with a diet and exercise plan to lose weight safely. Do not use any products that contain nicotine or  tobacco. These products include cigarettes, chewing tobacco, and vaping devices, such as e-cigarettes. If you need help quitting, ask your health care provider. Alcohol use Do not drink alcohol if: Your health care provider tells you not to drink. You are pregnant, may be pregnant, or are planning to become pregnant. If you drink alcohol: Limit how much you have to: 0-1 drink a day for women. 0-2 drinks a day for men. Know how much alcohol is in your drink. In the [place] one drink equals one 12 oz bottle of beer (355 mL), one 5 oz glass of wine (148 mL), or one 1 oz glass of hard liquor (44 mL). Activity  Get enough exercise. Do exercises as told by your health care provider. Each week, do at least 150 minutes of exercise that takes a medium level of effort (moderate-intensity exercise). This kind of exercise: Makes your heart beat faster while allowing you to still be able to talk. Can be done in short sessions several times a day or longer sessions a few times a week. For example, on 5 days each week, you could walk fast or ride your bike 3 times a day for 10 minutes each time. Medicines Your health care provider may recommend medicines to help lower cholesterol. This may be a medicine to lower the amount of cholesterol that your liver makes. You may need medicine if: Diet and lifestyle changes have not lowered your cholesterol enough. You have high cholesterol and other risk factors for heart disease or stroke. Take over-the-counter and prescription medicines only as told by your health care provider. General information Manage your risk factors for high cholesterol. Talk with your health care provider about all your risk factors and how to lower your risk. Manage other conditions that you have, such as diabetes or high blood pressure (hypertension). Have blood tests to check your cholesterol levels at regular points in time as told by your health care provider. Keep all follow-up visits.  This  is important. Where to find more information American Heart Association: www.heart.org National Heart, Lung, and Blood Institute: popsteam.is Summary High cholesterol increases your risk for heart disease and stroke. By keeping your cholesterol level low, you can reduce your risk for these conditions. High cholesterol can often be prevented with diet and lifestyle changes. Work with your health care provider to manage your risk factors, and have your blood tested regularly. This information is not intended to replace advice given to you by your health care provider. Make sure you discuss any questions you have with your health care provider. Document Revised: 04/15/2022 Document Reviewed: 11/16/2020 Elsevier Patient Education  2024 [employer]. he cannot go open the door and they will be at the desk so they are okay you walk                                                                                                                                                                                                                  The above assessment and management plan was discussed with the patient. The patient verbalized understanding of and has agreed to the management plan. Patient is aware to call the clinic if they develop any new symptoms or if symptoms persist or worsen. Patient is aware when to return to the clinic for a follow-up visit. Patient educated on when it is appropriate to go to the emergency department.    Neshia Mckenzie [place] Thompson, DNP [facility] [address] [place], [place] [address] [phone]       [1] No Known Allergies  "

## 2024-10-16 ENCOUNTER — Ambulatory Visit: Payer: Self-pay | Admitting: Nurse Practitioner

## 2024-10-16 DIAGNOSIS — E559 Vitamin D deficiency, unspecified: Secondary | ICD-10-CM

## 2024-10-16 LAB — CBC WITH DIFFERENTIAL/PLATELET
Basophils Absolute: 0.1 x10E3/uL (ref 0.0–0.2)
Basos: 1 %
EOS (ABSOLUTE): 0.1 x10E3/uL (ref 0.0–0.4)
Eos: 1 %
Hematocrit: 45.5 % (ref 37.5–51.0)
Hemoglobin: 15.4 g/dL (ref 13.0–17.7)
Immature Grans (Abs): 0 x10E3/uL (ref 0.0–0.1)
Immature Granulocytes: 0 %
Lymphocytes Absolute: 1.6 x10E3/uL (ref 0.7–3.1)
Lymphs: 24 %
MCH: 31.8 pg (ref 26.6–33.0)
MCHC: 33.8 g/dL (ref 31.5–35.7)
MCV: 94 fL (ref 79–97)
Monocytes Absolute: 0.6 x10E3/uL (ref 0.1–0.9)
Monocytes: 9 %
Neutrophils Absolute: 4.2 x10E3/uL (ref 1.4–7.0)
Neutrophils: 65 %
Platelets: 252 x10E3/uL (ref 150–450)
RBC: 4.84 x10E6/uL (ref 4.14–5.80)
RDW: 12 % (ref 11.6–15.4)
WBC: 6.5 x10E3/uL (ref 3.4–10.8)

## 2024-10-16 LAB — CMP14+EGFR
ALT: 19 IU/L (ref 0–44)
AST: 21 IU/L (ref 0–40)
Albumin: 4.3 g/dL (ref 3.8–4.8)
Alkaline Phosphatase: 124 IU/L — ABNORMAL HIGH (ref 47–123)
BUN/Creatinine Ratio: 15 (ref 10–24)
BUN: 16 mg/dL (ref 8–27)
Bilirubin Total: 0.8 mg/dL (ref 0.0–1.2)
CO2: 25 mmol/L (ref 20–29)
Calcium: 9.6 mg/dL (ref 8.6–10.2)
Chloride: 102 mmol/L (ref 96–106)
Creatinine, Ser: 1.08 mg/dL (ref 0.76–1.27)
Globulin, Total: 2.7 g/dL (ref 1.5–4.5)
Glucose: 88 mg/dL (ref 70–99)
Potassium: 5.2 mmol/L (ref 3.5–5.2)
Sodium: 142 mmol/L (ref 134–144)
Total Protein: 7 g/dL (ref 6.0–8.5)
eGFR: 73 mL/min/1.73

## 2024-10-16 LAB — B12 AND FOLATE PANEL
Folate: >20 ng/mL
Vitamin B-12: 1258 pg/mL — ABNORMAL HIGH (ref 232–1245)

## 2024-10-16 LAB — LIPID PANEL
Chol/HDL Ratio: 2.6 ratio (ref 0.0–5.0)
Cholesterol, Total: 176 mg/dL (ref 100–199)
HDL: 67 mg/dL
LDL Chol Calc (NIH): 92 mg/dL (ref 0–99)
Triglycerides: 97 mg/dL (ref 0–149)
VLDL Cholesterol Cal: 17 mg/dL (ref 5–40)

## 2024-10-16 LAB — THYROID PANEL WITH TSH
Free Thyroxine Index: 1.8 (ref 1.2–4.9)
T3 Uptake Ratio: 25 % (ref 24–39)
T4, Total: 7.2 ug/dL (ref 4.5–12.0)
TSH: 1.37 u[IU]/mL (ref 0.450–4.500)

## 2024-10-16 LAB — VITAMIN D 25 HYDROXY (VIT D DEFICIENCY, FRACTURES): Vit D, 25-Hydroxy: 26.5 ng/mL — ABNORMAL LOW (ref 30.0–100.0)

## 2024-10-16 MED ORDER — VITAMIN D3 50 MCG (2000 UT) PO CAPS: 2000.0000 [IU] | ORAL_CAPSULE | Freq: Every day | ORAL | 1 refills | Status: AC

## 2025-04-15 ENCOUNTER — Ambulatory Visit: Admitting: Family Medicine
